# Patient Record
Sex: Female | Born: 1955
Health system: Southern US, Community
[De-identification: ages and names within clinical notes are randomized; demographics above are authoritative.]

## PROBLEM LIST (undated history)

## (undated) DIAGNOSIS — J302 Other seasonal allergic rhinitis: Secondary | ICD-10-CM

## (undated) HISTORY — PX: MOUTH SURGERY: SHX715

## (undated) HISTORY — DX: Other seasonal allergic rhinitis: J30.2

## (undated) HISTORY — PX: ELBOW SURGERY: SHX618

---

## 2003-05-10 ENCOUNTER — Encounter: Admission: RE | Admit: 2003-05-10 | Discharge: 2003-06-01 | Payer: Self-pay | Admitting: Nurse Practitioner

## 2011-08-20 ENCOUNTER — Ambulatory Visit (INDEPENDENT_AMBULATORY_CARE_PROVIDER_SITE_OTHER): Payer: Federal, State, Local not specified - PPO | Admitting: Internal Medicine

## 2011-08-20 ENCOUNTER — Encounter: Payer: Self-pay | Admitting: Internal Medicine

## 2011-08-20 VITALS — BP 130/80 | HR 89 | Temp 97.5°F | Resp 16 | Ht 67.5 in | Wt 195.0 lb

## 2011-08-20 DIAGNOSIS — E663 Overweight: Secondary | ICD-10-CM | POA: Insufficient documentation

## 2011-08-20 DIAGNOSIS — Z780 Asymptomatic menopausal state: Secondary | ICD-10-CM | POA: Insufficient documentation

## 2011-08-20 DIAGNOSIS — N951 Menopausal and female climacteric states: Secondary | ICD-10-CM

## 2011-08-20 DIAGNOSIS — M6580 Other synovitis and tenosynovitis, unspecified site: Secondary | ICD-10-CM

## 2011-08-20 DIAGNOSIS — M778 Other enthesopathies, not elsewhere classified: Secondary | ICD-10-CM

## 2011-08-20 LAB — CBC WITH DIFFERENTIAL/PLATELET
Basophils Absolute: 0 10*3/uL (ref 0.0–0.1)
Basophils Relative: 0 % (ref 0–1)
Eosinophils Absolute: 0.2 10*3/uL (ref 0.0–0.7)
Eosinophils Relative: 2 % (ref 0–5)
HCT: 36.7 % (ref 36.0–46.0)
Hemoglobin: 12.7 g/dL (ref 12.0–15.0)
MCH: 32.6 pg (ref 26.0–34.0)
MCHC: 34.6 g/dL (ref 30.0–36.0)
Monocytes Absolute: 0.7 10*3/uL (ref 0.1–1.0)
Monocytes Relative: 9 % (ref 3–12)
RDW: 14.3 % (ref 11.5–15.5)

## 2011-08-20 LAB — T3, FREE: T3, Free: 2.9 pg/mL (ref 2.3–4.2)

## 2011-08-20 NOTE — Progress Notes (Signed)
  Subjective:    Patient ID: Nicole Haney, female    DOB: 1955/07/14, 56 y.o.   MRN: [id number]  HPI New pt here for first visit.  Former care Dr. Fanny Dance. PMH of elbow tendinitis and .  She is concerned about weight gain surrrounding menopause.  Has occasional vasomotor flushes but not too bothersome.  She exercises and tries to reduce calories but stilll gains weight.  No hypothyroid symptoms.   No Known Allergies History reviewed. No pertinent past medical history. Past Surgical History  Procedure Date  . Elbow surgery     RT  . Mouth surgery     lower jaw gums   History   Social History  . Marital Status: Married    Spouse Name: N/A    Number of Children: N/A  . 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 Smoking status: Current Everyday Smoker -- 0.5 packs/day for 25 years    Types: Cigarettes  . Smokeless tobacco: Never Used  . Alcohol Use: Yes     occassional wine  . Drug Use: No  . Sexually Active: Yes -- Female partner(s)   Other Topics Concern  . Not on file   Social History Narrative  . No narrative on file   Family History  Problem Relation Age of Onset  . Cancer Maternal Grandmother     breast  . Cancer Mother     ovarian   Patient Active Problem List  Diagnoses  . Overweight  . Tendinitis of elbow  . Menopause   Current Outpatient Prescriptions on File Prior to Visit  Medication Sig Dispense Refill  . Calcium Carb-Cholecalciferol (CALCIUM 1000 + D) 1000-800 MG-UNIT TABS Take by mouth daily.            Review of Systems    see HPI Objective:   Physical Exam Physical Exam  Nursing note and vitals reviewed.  Constitutional: She is oriented to person, place, and time. She appears well-developed and well-nourished.  HENT:  Head: Normocephalic and atraumatic.  Cardiovascular: Normal rate and regular rhythm. Exam reveals no gallop and no friction rub.  No murmur heard.    Pulmonary/Chest: Breath sounds normal. She has no wheezes. She has no rales.  Neurological: She is alert and oriented to person, place, and time.  Skin: Skin is warm and dry.  Psychiatric: She has a normal mood and affect. Her behavior is normal.         Assessment & Plan:  1)  Overweight:  Will refer to Dr. Kinnie Scales for consultaion  Check thyroid levels today 2)  Elbow tenditinis 3)  Menopause  Schedule CPE  Check labs today

## 2011-08-21 ENCOUNTER — Telehealth: Payer: Self-pay | Admitting: *Deleted

## 2011-08-21 LAB — COMPREHENSIVE METABOLIC PANEL
Albumin: 4.3 g/dL (ref 3.5–5.2)
Alkaline Phosphatase: 71 U/L (ref 39–117)
BUN: 12 mg/dL (ref 6–23)
Creat: 0.59 mg/dL (ref 0.50–1.10)
Glucose, Bld: 73 mg/dL (ref 70–99)
Total Bilirubin: 0.4 mg/dL (ref 0.3–1.2)

## 2011-08-21 LAB — LIPID PANEL
HDL: 85 mg/dL (ref >39)
LDL Cholesterol: 104 mg/dL — ABNORMAL HIGH (ref 0–99)
Total CHOL/HDL Ratio: 2.4 Ratio
Triglycerides: 72 mg/dL (ref <150)

## 2011-08-21 NOTE — Telephone Encounter (Signed)
Copy of labs mailed to pt's home address.  Pt notified to take 2000units of Vit D3 with her Calcium 1000-1200mg  daily.  Thyroid is normal

## 2012-03-01 ENCOUNTER — Telehealth: Payer: Self-pay | Admitting: *Deleted

## 2012-03-01 NOTE — Telephone Encounter (Signed)
appt for CPE pap and mm in feb

## 2012-05-12 ENCOUNTER — Ambulatory Visit (INDEPENDENT_AMBULATORY_CARE_PROVIDER_SITE_OTHER): Payer: Federal, State, Local not specified - PPO | Admitting: Internal Medicine

## 2012-05-12 ENCOUNTER — Ambulatory Visit (HOSPITAL_BASED_OUTPATIENT_CLINIC_OR_DEPARTMENT_OTHER)
Admission: RE | Admit: 2012-05-12 | Discharge: 2012-05-12 | Disposition: A | Discharge status: Home / Self Care | Payer: Federal, State, Local not specified - PPO | Source: Ambulatory Visit | Attending: Internal Medicine | Admitting: Internal Medicine

## 2012-05-12 ENCOUNTER — Encounter: Payer: Federal, State, Local not specified - PPO | Admitting: Internal Medicine

## 2012-05-12 ENCOUNTER — Encounter: Payer: Self-pay | Admitting: Internal Medicine

## 2012-05-12 VITALS — BP 136/86 | HR 85 | Temp 98.1°F | Resp 18 | Ht 67.5 in | Wt 193.0 lb

## 2012-05-12 DIAGNOSIS — E663 Overweight: Secondary | ICD-10-CM

## 2012-05-12 DIAGNOSIS — Z1231 Encounter for screening mammogram for malignant neoplasm of breast: Secondary | ICD-10-CM | POA: Insufficient documentation

## 2012-05-12 DIAGNOSIS — Z0000 Encounter for general adult medical examination without abnormal findings: Secondary | ICD-10-CM

## 2012-05-12 DIAGNOSIS — M6580 Other synovitis and tenosynovitis, unspecified site: Secondary | ICD-10-CM

## 2012-05-12 DIAGNOSIS — Z780 Asymptomatic menopausal state: Secondary | ICD-10-CM

## 2012-05-12 DIAGNOSIS — M778 Other enthesopathies, not elsewhere classified: Secondary | ICD-10-CM

## 2012-05-12 DIAGNOSIS — F17200 Nicotine dependence, unspecified, uncomplicated: Secondary | ICD-10-CM

## 2012-05-12 LAB — POCT URINALYSIS DIPSTICK
Bilirubin, UA: NEGATIVE
Blood, UA: NEGATIVE
Glucose, UA: NEGATIVE
Ketones, UA: NEGATIVE
Spec Grav, UA: 1.015

## 2012-05-12 NOTE — Progress Notes (Signed)
Subjective:    Patient ID: Nicole Haney, female    DOB: 06/21/1955, 57 y.o.   MRN: [id number]  HPI Nicole Haney is here for CPE/    Doing well.  R elbow tendon repair.  Rarely takes anything for pain  Menopause  Few hot flashes.  No bothersome symptoms  Overweight  She did not see Dr. Kinnie Haney.  Watching diet and trying to exercise  No Known Allergies History reviewed. No pertinent past medical history. Past Surgical History  Procedure Laterality Date  . Elbow surgery      RT  . Mouth surgery      lower jaw gums   History   Social History  . Marital Status: Married    Spouse Name: N/A    Number of Children: N/A  . 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 Smoking status: Current Every Day Smoker -- 0.50 packs/day for 25 years    Types: Cigarettes  . Smokeless tobacco: Never Used  . Alcohol Use: Yes     Comment: occassional wine  . Drug Use: No  . Sexually Active: Yes -- Female partner(s)   Other Topics Concern  . Not on file   Social History Narrative  . No narrative on file   Family History  Problem Relation Age of Onset  . Cancer Maternal Grandmother     breast  . Cancer Mother     ovarian   Patient Active Problem List  Diagnosis  . Overweight  . Tendinitis of elbow  . Menopause   Current Outpatient Prescriptions on File Prior to Visit  Medication Sig Dispense Refill  . Calcium Carb-Cholecalciferol (CALCIUM 1000 + D) 1000-800 MG-UNIT TABS Take by mouth daily.      . calcium carbonate (TUMS - DOSED IN MG ELEMENTAL CALCIUM) 500 MG chewable tablet Chew 1 tablet by mouth as needed.       No current facility-administered medications on file prior to visit.      Review of Systems  All other systems reviewed and are negative.        Objective:   Physical Exam  Physical Exam  Vital signs and nursing note reviewed  Constitutional: She is oriented to person, place, and time. She appears  well-developed and well-nourished. She is cooperative.  HENT:  Head: Normocephalic and atraumatic.  Right Ear: Tympanic membrane normal.  Left Ear: Tympanic membrane normal.  Nose: Nose normal.  Mouth/Throat: Oropharynx is clear and moist and mucous membranes are normal. No oropharyngeal exudate or posterior oropharyngeal erythema.  Eyes: Conjunctivae and EOM are normal. Pupils are equal, round, and reactive to light.  Neck: Neck supple. No JVD present. Carotid bruit is not present. No mass and no thyromegaly present.  Cardiovascular: Regular rhythm, normal heart sounds, intact distal pulses and normal pulses.  Exam reveals no gallop and no friction rub.   No murmur heard. Pulses:      Dorsalis pedis pulses are 2+ on the right side, and 2+ on the left side.  Pulmonary/Chest: Breath sounds normal. She has no wheezes. She has no rhonchi. She has no rales. Right breast exhibits no mass, no nipple discharge and no skin change. Left breast exhibits no mass, no nipple discharge and no skin change.  Abdominal: Soft. Bowel sounds are normal. She exhibits no distension and no mass. There is no hepatosplenomegaly. There is no tenderness. There is no CVA tenderness.  Genitourinary: Rectum normal, vagina normal  and uterus normal. Rectal exam shows no mass. Guaiac negative stool. No labial fusion. There is no lesion on the right labia. There is no lesion on the left labia. Cervix exhibits no motion tenderness. Right adnexum displays no mass, no tenderness and no fullness. Left adnexum displays no mass, no tenderness and no fullness. No erythema around the vagina.  Musculoskeletal:       No active synovitis to any joint.    Lymphadenopathy:       Right cervical: No superficial cervical adenopathy present.      Left cervical: No superficial cervical adenopathy present.       Right axillary: No pectoral and no lateral adenopathy present.       Left axillary: No pectoral and no lateral adenopathy present.       Right: No inguinal adenopathy present.       Left: No inguinal adenopathy present.  Neurological: She is alert and oriented to person, place, and time. She has normal strength and normal reflexes. No cranial nerve deficit or sensory deficit. She displays a negative Romberg sign. Coordination and gait normal.  Skin: Skin is warm and dry. No abrasion, no bruising, no ecchymosis and no rash noted. No cyanosis. Nails show no clubbing.  Psychiatric: She has a normal mood and affect. Her speech is normal and behavior is normal.          Assessment & Plan:  Health Maintenance:  See scanned HM sheet  MM today  Declines all vaccines Overweight   DASH diet given  Tobacco use:  Cessation counseling given.  nOt intrested in RX  Tendinitis  Use Nsaid prn      Assessment & Plan:

## 2012-05-12 NOTE — Patient Instructions (Addendum)
See me in office if not better

## 2012-05-19 ENCOUNTER — Encounter: Payer: Self-pay | Admitting: *Deleted

## 2012-07-29 LAB — HM PAP SMEAR: HM Pap smear: NORMAL

## 2012-12-31 ENCOUNTER — Telehealth: Payer: Self-pay | Admitting: *Deleted

## 2012-12-31 ENCOUNTER — Emergency Department (HOSPITAL_BASED_OUTPATIENT_CLINIC_OR_DEPARTMENT_OTHER): Payer: Federal, State, Local not specified - PPO

## 2012-12-31 ENCOUNTER — Encounter (HOSPITAL_BASED_OUTPATIENT_CLINIC_OR_DEPARTMENT_OTHER): Payer: Self-pay | Admitting: *Deleted

## 2012-12-31 ENCOUNTER — Emergency Department (HOSPITAL_BASED_OUTPATIENT_CLINIC_OR_DEPARTMENT_OTHER)
Admission: EM | Admit: 2012-12-31 | Discharge: 2012-12-31 | Disposition: A | Discharge status: Home / Self Care | Payer: Federal, State, Local not specified - PPO | Attending: Emergency Medicine | Admitting: Emergency Medicine

## 2012-12-31 DIAGNOSIS — Z79899 Other long term (current) drug therapy: Secondary | ICD-10-CM | POA: Insufficient documentation

## 2012-12-31 DIAGNOSIS — K529 Noninfective gastroenteritis and colitis, unspecified: Secondary | ICD-10-CM

## 2012-12-31 DIAGNOSIS — F17200 Nicotine dependence, unspecified, uncomplicated: Secondary | ICD-10-CM | POA: Insufficient documentation

## 2012-12-31 DIAGNOSIS — K5289 Other specified noninfective gastroenteritis and colitis: Secondary | ICD-10-CM | POA: Insufficient documentation

## 2012-12-31 LAB — COMPREHENSIVE METABOLIC PANEL
ALT: 13 U/L (ref 0–35)
Alkaline Phosphatase: 84 U/L (ref 39–117)
BUN: 11 mg/dL (ref 6–23)
CO2: 28 mEq/L (ref 19–32)
Calcium: 10.1 mg/dL (ref 8.4–10.5)
Chloride: 96 mEq/L (ref 96–112)
GFR calc Af Amer: >90 mL/min (ref >90)
Glucose, Bld: 99 mg/dL (ref 70–99)
Potassium: 4 mEq/L (ref 3.5–5.1)
Sodium: 134 mEq/L — ABNORMAL LOW (ref 135–145)
Total Bilirubin: 0.5 mg/dL (ref 0.3–1.2)

## 2012-12-31 LAB — URINALYSIS, ROUTINE W REFLEX MICROSCOPIC
Glucose, UA: NEGATIVE mg/dL
Hgb urine dipstick: NEGATIVE
Specific Gravity, Urine: 1.009 (ref 1.005–1.030)
pH: 7.5 (ref 5.0–8.0)

## 2012-12-31 LAB — LIPASE, BLOOD: Lipase: 20 U/L (ref 11–59)

## 2012-12-31 LAB — CBC
HCT: 43.7 % (ref 36.0–46.0)
Hemoglobin: 15.3 g/dL — ABNORMAL HIGH (ref 12.0–15.0)
RBC: 4.56 MIL/uL (ref 3.87–5.11)
WBC: 9.4 10*3/uL (ref 4.0–10.5)

## 2012-12-31 MED ORDER — ONDANSETRON 4 MG PO TBDP
4.0000 mg | ORAL_TABLET | Freq: Once | ORAL | Status: AC
  Administered 2012-12-31: 4 mg via ORAL

## 2012-12-31 MED ORDER — IOHEXOL 300 MG/ML  SOLN
100.0000 mL | Freq: Once | INTRAMUSCULAR | Status: AC | PRN
  Administered 2012-12-31: 100 mL via INTRAVENOUS

## 2012-12-31 MED ORDER — SODIUM CHLORIDE 0.9 % IV BOLUS (SEPSIS)
1000.0000 mL | Freq: Once | INTRAVENOUS | Status: AC
  Administered 2012-12-31: 1000 mL via INTRAVENOUS

## 2012-12-31 MED ORDER — IOHEXOL 300 MG/ML  SOLN
50.0000 mL | Freq: Once | INTRAMUSCULAR | Status: AC | PRN
  Administered 2012-12-31: 50 mL via ORAL

## 2012-12-31 MED ORDER — ONDANSETRON 4 MG PO TBDP
ORAL_TABLET | ORAL | Status: AC
  Filled 2012-12-31: qty 1

## 2012-12-31 MED ORDER — OXYCODONE-ACETAMINOPHEN 5-325 MG PO TABS: 1.0000 | ORAL_TABLET | Freq: Four times a day (QID) | ORAL | Status: DC | PRN

## 2012-12-31 MED ORDER — MORPHINE SULFATE 4 MG/ML IJ SOLN
4.0000 mg | Freq: Once | INTRAMUSCULAR | Status: AC
  Administered 2012-12-31: 4 mg via INTRAVENOUS
  Filled 2012-12-31: qty 1

## 2012-12-31 NOTE — ED Notes (Signed)
MD at bedside. 

## 2012-12-31 NOTE — Telephone Encounter (Signed)
Pt called with a C/O abd pain pt in severe pain advised pt to come to [facility] ED right now will check on pt

## 2012-12-31 NOTE — ED Notes (Signed)
Patient given 1st cup of oral contrast at 9:50 and asked her to have that cup drank by 10:10

## 2012-12-31 NOTE — ED Provider Notes (Signed)
CSN: [id number]     Arrival date & time 12/31/12  0865 History   First MD Initiated Contact with Patient 12/31/12 [phone]     Chief Complaint  Patient presents with  . Abdominal Pain   (Consider location/radiation/quality/duration/timing/severity/associated sxs/prior Treatment) Patient is a 57 y.o. female presenting with abdominal pain. The history is provided by the patient.  Abdominal Pain Pain location:  Epigastric and periumbilical Pain quality: aching and sharp   Pain radiates to:  Does not radiate Pain severity:  Moderate Onset quality:  Gradual Timing:  Constant Progression:  Worsening Chronicity:  New Context: sick contacts (volunteers at a school)   Context: not alcohol use, not recent illness, not recent travel, not retching, not suspicious food intake and not trauma   Relieved by:  Nothing Worsened by:  Nothing tried Associated symptoms: diarrhea (last BM this morning) and nausea   Associated symptoms: no cough, no fever and no vomiting     History reviewed. No pertinent past medical history. Past Surgical History  Procedure Laterality Date  . Elbow surgery      RT  . Mouth surgery      lower jaw gums   Family History  Problem Relation Age of Onset  . Cancer Maternal Grandmother     breast  . Cancer Mother     ovarian   History  Substance Use Topics  . Smoking status: Current Every Day Smoker -- 0.50 packs/day for 25 years    Types: Cigarettes  . Smokeless tobacco: Never Used  . Alcohol Use: Yes     Comment: occassional wine   OB History   Grav Para Term Preterm Abortions TAB SAB Ect Mult Living   4 2   2  2   2      Review of Systems  Constitutional: Negative for fever.  Respiratory: Negative for cough.   Gastrointestinal: Positive for nausea, abdominal pain and diarrhea (last BM this morning). Negative for vomiting.  All other systems reviewed and are negative.    Allergies  Review of patient's allergies indicates no known allergies.  Home  Medications   Current Outpatient Rx  Name  Route  Sig  Dispense  Refill  . Calcium Carb-Cholecalciferol (CALCIUM 1000 + D) 1000-800 MG-UNIT TABS   Oral   Take by mouth daily.         . calcium carbonate (TUMS - DOSED IN MG ELEMENTAL CALCIUM) 500 MG chewable tablet   Oral   Chew 1 tablet by mouth as needed.          BP 144/96  Pulse 85  Temp(Src) 97.8 F (36.6 C) (Oral)  Resp 20  SpO2 99% Physical Exam  Nursing note and vitals reviewed. Constitutional: She is oriented to person, place, and time. She appears well-developed and well-nourished. No distress.  HENT:  Head: Normocephalic and atraumatic.  Eyes: EOM are normal. Pupils are equal, round, and reactive to light.  Neck: Normal range of motion. Neck supple.  Cardiovascular: Normal rate and regular rhythm.  Exam reveals no friction rub.   No murmur heard. Pulmonary/Chest: Effort normal and breath sounds normal. No respiratory distress. She has no wheezes. She has no rales.  Abdominal: Soft. She exhibits no distension and no mass. There is tenderness (epigastrum, LLQ, RLQ). There is no rebound and no guarding.  Musculoskeletal: Normal range of motion. She exhibits no edema.  Neurological: She is alert and oriented to person, place, and time.  Skin: She is not diaphoretic.    ED Course  Procedures (including critical care time) Labs Review Labs Reviewed  URINALYSIS, ROUTINE W REFLEX MICROSCOPIC  CBC  COMPREHENSIVE METABOLIC PANEL  LIPASE, BLOOD  LACTIC ACID, PLASMA   Imaging Review Ct Abdomen Pelvis W Contrast  12/31/2012   CLINICAL DATA:  Central abdominal pain.  EXAM: CT ABDOMEN AND PELVIS WITH CONTRAST  TECHNIQUE: Multidetector CT imaging of the abdomen and pelvis was performed using the standard protocol following bolus administration of intravenous contrast.  CONTRAST:  OMNIPAQUE IOHEXOL 300 MG/ML SOLN, 50mL OMNIPAQUE IOHEXOL 300 MG/ML SOLN  COMPARISON:  None.  FINDINGS: The lung bases are clear without  focal nodule, mass, or airspace disease. The heart size is normal. No significant pleural or pericardial effusion is present.  The liver and spleen are within normal limits. The stomach, duodenum, and pancreas are unremarkable. The common bile duct and gallbladder are within normal limits. The adrenal glands are normal bilaterally. The kidneys and ureters are within normal limits. The urinary bladder is unremarkable.  The rectus sigmoid colon is within normal limits. The descending and distal transverse colon is within normal limits. There is a focal area of marked wall thickening at the hepatic flexure and proximal transverse colon. Slight inflammatory changes are noted within the mesenteries. Additional irregular thickening is noted at the cecum. No significant adenopathy is associated.  The appendix is visualized and normal. Scattered vascular calcifications are present within the aorta without aneurysm.  The uterus and adnexa are within normal limits for age.  The bone windows demonstrate no focal lytic or blastic lesions. A vacuum disc is present at L5-S1.  IMPRESSION: 1. Circumferential thickening about the colon at the hepatic flexure with asymmetric wall thickening in the proximal transverse colon. Irregular wall thickening is also present in the cecum. Relatively little inflammatory changes associated with these areas. The differential diagnosis includes adenocarcinoma of the colon. Colitis, either infectious or ischemic is also considered. Inflammatory bowel disease, specifically Crohn's disease is also considered. However, the terminal ileum is relatively normal. 2. Normal appearance of the appendix.  These results were called by telephone at the time of interpretation on 12/31/2012 at 12:19 PM to Dr. Marena Chancy , who verbally acknowledged these results.   Electronically Signed   By: Gennette Pac M.D.   On: 12/31/2012 12:20     MDM   1. Colitis    57F with constant central abdominal pain.  Sharp, non radiating, however migrates around lower abdomen. No fevers. Mild nausea, associated diarrhea. Prior abdominal surgery: BTL.  AFVSS here. Epigastric pain, no RUQ tenderness, bilateral lower quadrant tenderness. History not c/w kidney stone. Could be possible appendicitis, colitis. Will CT scan. Pain meds, fluids given.  The CT shows possible mass of his hepatic flexure of colon. Minimal inflammatory changes of colon to suggest colitis.  Differential also includes IBD, ischemic colitis. I spoke with the GI doctor but you'll GI he stated that he made a scope, however doesn't need or emergently. I spoke to the patient about these results he states she had a colonoscopy 6 months ago that was normal.  Her acute presentation seems less likely for this to be malignant. I spoke again with Dr. Alfredo Batty about possible ischemic colitis and the need for a CT angiogram of the abdomen, he stated that she has no inflammatory changes around the vessels and all the vessels appear patent based on the scan and he didn't feel an extra angiography would add anymore information. I spoke to the patient about this. She would like to  go home she is feeling better. Her labs are normal. She doesn't have any acute surgical emergency. Like this is reasonable. Patient given strict return precautions. Instructed to followup with her regular doctor and with her GI doctor soon as possible. Stable for discharge.    Dagmar Hait, MD 12/31/12 [phone]

## 2012-12-31 NOTE — ED Notes (Signed)
Pt drinking second cup of oral contrast.

## 2012-12-31 NOTE — ED Notes (Signed)
Patient transported to CT 

## 2012-12-31 NOTE — ED Notes (Signed)
Mid abdominal pain onset yesterday morning along with diarrhea having some nausea no emesis. Pt states the pain worse when she moves or takes a deep breath. Denies any radiation of the pain.

## 2013-01-03 ENCOUNTER — Encounter: Payer: Self-pay | Admitting: *Deleted

## 2013-01-03 ENCOUNTER — Ambulatory Visit (INDEPENDENT_AMBULATORY_CARE_PROVIDER_SITE_OTHER): Payer: Federal, State, Local not specified - PPO | Admitting: Internal Medicine

## 2013-01-03 ENCOUNTER — Encounter: Payer: Self-pay | Admitting: Internal Medicine

## 2013-01-03 VITALS — BP 127/88 | HR 80 | Temp 97.4°F | Resp 18 | Wt 190.0 lb

## 2013-01-03 DIAGNOSIS — R109 Unspecified abdominal pain: Secondary | ICD-10-CM

## 2013-01-03 DIAGNOSIS — K5289 Other specified noninfective gastroenteritis and colitis: Secondary | ICD-10-CM

## 2013-01-03 DIAGNOSIS — K529 Noninfective gastroenteritis and colitis, unspecified: Secondary | ICD-10-CM

## 2013-01-03 LAB — CBC WITH DIFFERENTIAL/PLATELET
Basophils Relative: 0 % (ref 0–1)
Eosinophils Absolute: 0.1 10*3/uL (ref 0.0–0.7)
Eosinophils Relative: 2 % (ref 0–5)
HCT: 44.6 % (ref 36.0–46.0)
Lymphocytes Relative: 22 % (ref 12–46)
Lymphs Abs: 1.9 10*3/uL (ref 0.7–4.0)
MCH: 34.1 pg — ABNORMAL HIGH (ref 26.0–34.0)
MCHC: 35.2 g/dL (ref 30.0–36.0)
MCV: 96.7 fL (ref 78.0–100.0)
Monocytes Relative: 9 % (ref 3–12)
Neutro Abs: 5.9 10*3/uL (ref 1.7–7.7)
Neutrophils Relative %: 67 % (ref 43–77)
Platelets: 290 10*3/uL (ref 150–400)
RBC: 4.61 MIL/uL (ref 3.87–5.11)
WBC: 8.7 10*3/uL (ref 4.0–10.5)

## 2013-01-03 LAB — BASIC METABOLIC PANEL
CO2: 27 mEq/L (ref 19–32)
Calcium: 10 mg/dL (ref 8.4–10.5)
Chloride: 101 mEq/L (ref 96–112)
Creat: 0.59 mg/dL (ref 0.50–1.10)
Glucose, Bld: 83 mg/dL (ref 70–99)
Sodium: 135 mEq/L (ref 135–145)

## 2013-01-03 NOTE — Progress Notes (Signed)
Subjective:    Patient ID: Nicole Haney, female    DOB: [birth year], 57 y.o.   MRN: [id number]  HPI  Nicole Haney is here for ER follow up.  She was seen in ER on 10/3 with severe abd pain and nausea and diarrhea  CT showed  Question of inflammatory disease or possibly ischemic bowel with thickening in hepatic flexure.    She does have a GI MD [place]. [place]  She tells me she had a colonoscopy 6 months ago and told it was fine except for diverticulosis  No Known Allergies History reviewed. No pertinent past medical history. Past Surgical History  Procedure Laterality Date  . Elbow surgery      RT  . Mouth surgery      lower jaw gums   History   Social History  . Marital Status: Married    Spouse Name: N/A    Number of Children: N/A  . 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 Smoking status: Current Every Day Smoker -- 0.50 packs/day for 25 years    Types: Cigarettes  . Smokeless tobacco: Never Used  . Alcohol Use: Yes     Comment: occassional wine  . Drug Use: No  . Sexual Activity: Yes    Partners: Male   Other Topics Concern  . Not on file   Social History Narrative  . No narrative on file   Family History  Problem Relation Age of Onset  . Cancer Maternal Grandmother     breast  . Cancer Mother     ovarian   Patient Active Problem List   Diagnosis Date Noted  . Tobacco use disorder 05/12/2012  . Overweight 08/20/2011  . Tendinitis of elbow 08/20/2011  . Menopause 08/20/2011   Current Outpatient Prescriptions on File Prior to Visit  Medication Sig Dispense Refill  . Calcium Carb-Cholecalciferol (CALCIUM 1000 + D) 1000-800 MG-UNIT TABS Take by mouth daily.      . calcium carbonate (TUMS - DOSED IN MG ELEMENTAL CALCIUM) 500 MG chewable tablet Chew 1 tablet by mouth as needed.      Marland Kitchen oxyCODONE-acetaminophen (PERCOCET/ROXICET) 5-325 MG per tablet Take 1 tablet by mouth every 6 (six) hours as needed  for pain.  15 tablet  0   No current facility-administered medications on file prior to visit.           Review of Systems    see HPI Objective:   Physical Exam  Physical Exam  Nursing note and vitals reviewed.  Constitutional: She is oriented to person, place, and time. She appears well-developed and well-nourished.  HENT:  Head: Normocephalic and atraumatic.  Cardiovascular: Normal rate and regular rhythm. Exam reveals no gallop and no friction rub.  No murmur heard.  Pulmonary/Chest: Breath sounds normal. She has no wheezes. She has no rales.  ABd  BS POs No HSM.  She is minimally tender in both lower quadrant.  Rectal no mass guaiac neg Neurological: She is alert and oriented to person, place, and time.  Skin: Skin is warm and dry.  Psychiatric: She has a normal mood and affect. Her behavior is normal.            Assessment & Plan:  Abd pain /  Abnormal CT at hepatic flexure  Will check CBC and BMP today   I advised pt to call her GI MD and make appt and that it is  very important that she have follow up colonoscopy.  She voiced understadning

## 2013-01-21 ENCOUNTER — Telehealth: Payer: Self-pay | Admitting: *Deleted

## 2013-01-21 NOTE — Telephone Encounter (Signed)
Notified pt that Dr Constance Goltz will not be able to sign her FMLA papers that Gi MD will have to do that.notified pt that Dr Constance Goltz will give her a note from her ER visit and until GI appt. Left VMM awaiting return call

## 2013-03-31 HISTORY — PX: ELBOW SURGERY: SHX618

## 2013-07-03 ENCOUNTER — Emergency Department (HOSPITAL_BASED_OUTPATIENT_CLINIC_OR_DEPARTMENT_OTHER)
Admission: EM | Admit: 2013-07-03 | Discharge: 2013-07-03 | Disposition: A | Discharge status: Home / Self Care | Payer: Federal, State, Local not specified - PPO | Attending: Emergency Medicine | Admitting: Emergency Medicine

## 2013-07-03 ENCOUNTER — Encounter (HOSPITAL_BASED_OUTPATIENT_CLINIC_OR_DEPARTMENT_OTHER): Payer: Self-pay | Admitting: Emergency Medicine

## 2013-07-03 DIAGNOSIS — Z79899 Other long term (current) drug therapy: Secondary | ICD-10-CM | POA: Insufficient documentation

## 2013-07-03 DIAGNOSIS — F17200 Nicotine dependence, unspecified, uncomplicated: Secondary | ICD-10-CM | POA: Insufficient documentation

## 2013-07-03 DIAGNOSIS — J029 Acute pharyngitis, unspecified: Secondary | ICD-10-CM

## 2013-07-03 DIAGNOSIS — R52 Pain, unspecified: Secondary | ICD-10-CM | POA: Insufficient documentation

## 2013-07-03 LAB — RAPID STREP SCREEN (MED CTR MEBANE ONLY): Streptococcus, Group A Screen (Direct): NEGATIVE

## 2013-07-03 NOTE — Discharge Instructions (Signed)
Pharyngitis °Pharyngitis is redness, pain, and swelling (inflammation) of your pharynx.  °CAUSES  °Pharyngitis is usually caused by infection. Most of the time, these infections are from viruses (viral) and are part of a cold. However, sometimes pharyngitis is caused by bacteria (bacterial). Pharyngitis can also be caused by allergies. Viral pharyngitis may be spread from person to person by coughing, sneezing, and personal items or utensils (cups, forks, spoons, toothbrushes). Bacterial pharyngitis may be spread from person to person by more intimate contact, such as kissing.  °SIGNS AND SYMPTOMS  °Symptoms of pharyngitis include:   °· Sore throat.   °· Tiredness (fatigue).   °· Low-grade fever.   °· Headache. °· Joint pain and muscle aches. °· Skin rashes. °· Swollen lymph nodes. °· Plaque-like film on throat or tonsils (often seen with bacterial pharyngitis). °DIAGNOSIS  °Your health care provider will ask you questions about your illness and your symptoms. Your medical history, along with a physical exam, is often all that is needed to diagnose pharyngitis. Sometimes, a rapid strep test is done. Other lab tests may also be done, depending on the suspected cause.  °TREATMENT  °Viral pharyngitis will usually get better in 3 4 days without the use of medicine. Bacterial pharyngitis is treated with medicines that kill germs (antibiotics).  °HOME CARE INSTRUCTIONS  °· Drink enough water and fluids to keep your urine clear or pale yellow.   °· Only take over-the-counter or prescription medicines as directed by your health care provider:   °· If you are prescribed antibiotics, make sure you finish them even if you start to feel better.   °· Do not take aspirin.   °· Get lots of rest.   °· Gargle with 8 oz of salt water (½ tsp of salt per 1 qt of water) as often as every 1 2 hours to soothe your throat.   °· Throat lozenges (if you are not at risk for choking) or sprays may be used to soothe your throat. °SEEK MEDICAL  CARE IF:  °· You have large, tender lumps in your neck. °· You have a rash. °· You cough up green, yellow-brown, or bloody spit. °SEEK IMMEDIATE MEDICAL CARE IF:  °· Your neck becomes stiff. °· You drool or are unable to swallow liquids. °· You vomit or are unable to keep medicines or liquids down. °· You have severe pain that does not go away with the use of recommended medicines. °· You have trouble breathing (not caused by a stuffy nose). °MAKE SURE YOU:  °· Understand these instructions. °· Will watch your condition. °· Will get help right away if you are not doing well or get worse. °Document Released: 03/17/2005 Document Revised: 01/05/2013 Document Reviewed: 11/22/2012 °[employer]® Patient Information ©2014 [employer]. ° °

## 2013-07-03 NOTE — ED Provider Notes (Signed)
Medical screening examination/treatment/procedure(s) were performed by non-physician practitioner and as supervising physician I was immediately available for consultation/collaboration.   EKG Interpretation None        Gwyneth SproutWhitney Vaeda Westall, MD 07/03/13 1444

## 2013-07-03 NOTE — ED Notes (Signed)
Sore throat, chills, generalized body aches since Thursday.  Pt states she feels worn out.

## 2013-07-03 NOTE — ED Provider Notes (Signed)
CSN: [id number]     Arrival date & time 07/03/13  1137 History   First MD Initiated Contact with Patient 07/03/13 1203     Chief Complaint  Patient presents with  . Sore Throat  . Generalized Body Aches     (Consider location/radiation/quality/duration/timing/severity/associated sxs/prior Treatment) Patient is a 58 y.o. female presenting with pharyngitis. The history is provided by the patient. No language interpreter was used.  Sore Throat This is a new problem. Episode onset: 4 days. The problem occurs constantly. The problem has been unchanged. Associated symptoms include a sore throat. Pertinent negatives include no fever or headaches. Nothing aggravates the symptoms. She has tried nothing for the symptoms. The treatment provided no relief.  Pt complains of a sorethroat.   No fever,  No cough  No past medical history on file. Past Surgical History  Procedure Laterality Date  . Elbow surgery      RT  . Mouth surgery      lower jaw gums   Family History  Problem Relation Age of Onset  . Cancer Maternal Grandmother     breast  . Cancer Mother     ovarian   History  Substance Use Topics  . Smoking status: Current Every Day Smoker -- 0.50 packs/day for 25 years    Types: Cigarettes  . Smokeless tobacco: Never Used  . Alcohol Use: Yes     Comment: occassional wine   OB History   Grav Para Term Preterm Abortions TAB SAB Ect Mult Living   4 2   2  2   2      Review of Systems  Constitutional: Negative for fever.  HENT: Positive for sore throat.   Neurological: Negative for headaches.  All other systems reviewed and are negative.      Allergies  Review of patient's allergies indicates no known allergies.  Home Medications   Current Outpatient Rx  Name  Route  Sig  Dispense  Refill  . Calcium Carb-Cholecalciferol (CALCIUM 1000 + D) 1000-800 MG-UNIT TABS   Oral   Take by mouth daily.         . calcium carbonate (TUMS - DOSED IN MG ELEMENTAL CALCIUM) 500 MG  chewable tablet   Oral   Chew 1 tablet by mouth as needed.         . folic acid (FOLVITE) 1 MG tablet   Oral   Take 1 mg by mouth daily.         . Probiotic Product (PROBIOTIC DAILY PO)   Oral   Take 1 tablet by mouth daily.          BP 139/89  Pulse 95  Temp(Src) 98.3 F (36.8 C) (Oral)  Resp 20  Ht 5\' 7"  (1.702 m)  Wt 175 lb (79.379 kg)  BMI 27.40 kg/m2  SpO2 99% Physical Exam  Nursing note and vitals reviewed. Constitutional: She is oriented to person, place, and time. She appears well-developed and well-nourished.  HENT:  Head: Normocephalic and atraumatic.  Right Ear: External ear normal.  Left Ear: External ear normal.  Nose: Nose normal.  Eyes: Pupils are equal, round, and reactive to light.  Neck: Normal range of motion.  Cardiovascular: Normal rate.   Pulmonary/Chest: Effort normal.  Abdominal: Soft.  Musculoskeletal: Normal range of motion.  Neurological: She is alert and oriented to person, place, and time. She has normal reflexes.  Skin: Skin is warm.  Psychiatric: She has a normal mood and affect.    ED Course  Procedures (including critical care time) Labs Review Labs Reviewed  RAPID STREP SCREEN   Imaging Review No results found.   EKG Interpretation None      MDM   Final diagnoses:  Pharyngitis    Strep negative,  Probable viral illness.   See primary for recheck in 3-4 days.   Ibuprofen, Increase fluids    Elson Areas, [place] 07/03/13 1227

## 2013-07-05 LAB — CULTURE, GROUP A STREP

## 2014-01-30 ENCOUNTER — Encounter (HOSPITAL_BASED_OUTPATIENT_CLINIC_OR_DEPARTMENT_OTHER): Payer: Self-pay | Admitting: Emergency Medicine

## 2014-07-12 ENCOUNTER — Other Ambulatory Visit: Payer: Self-pay | Admitting: *Deleted

## 2014-07-12 ENCOUNTER — Ambulatory Visit (INDEPENDENT_AMBULATORY_CARE_PROVIDER_SITE_OTHER): Payer: Federal, State, Local not specified - PPO | Admitting: Internal Medicine

## 2014-07-12 ENCOUNTER — Encounter: Payer: Self-pay | Admitting: Internal Medicine

## 2014-07-12 VITALS — BP 140/88 | HR 97 | Resp 16 | Ht 67.5 in | Wt 223.0 lb

## 2014-07-12 DIAGNOSIS — J301 Allergic rhinitis due to pollen: Secondary | ICD-10-CM

## 2014-07-12 DIAGNOSIS — H109 Unspecified conjunctivitis: Secondary | ICD-10-CM

## 2014-07-12 DIAGNOSIS — Z0000 Encounter for general adult medical examination without abnormal findings: Secondary | ICD-10-CM

## 2014-07-12 LAB — LIPID PANEL
Cholesterol: 199 mg/dL (ref 0–200)
HDL: 84 mg/dL (ref >=46)
LDL Cholesterol: 102 mg/dL — ABNORMAL HIGH (ref 0–99)
TRIGLYCERIDES: 67 mg/dL (ref <150)
Total CHOL/HDL Ratio: 2.4 Ratio
VLDL: 13 mg/dL (ref 0–40)

## 2014-07-12 LAB — CBC WITH DIFFERENTIAL/PLATELET
BASOS ABS: 0 10*3/uL (ref 0.0–0.1)
BASOS PCT: 0 % (ref 0–1)
EOS PCT: 1 % (ref 0–5)
Eosinophils Absolute: 0.1 10*3/uL (ref 0.0–0.7)
HEMATOCRIT: 39.4 % (ref 36.0–46.0)
HEMOGLOBIN: 13.3 g/dL (ref 12.0–15.0)
LYMPHS PCT: 19 % (ref 12–46)
Lymphs Abs: 1.5 10*3/uL (ref 0.7–4.0)
MCH: 32.1 pg (ref 26.0–34.0)
MCHC: 33.8 g/dL (ref 30.0–36.0)
MCV: 95.2 fL (ref 78.0–100.0)
MPV: 9.5 fL (ref 8.6–12.4)
Monocytes Absolute: 0.6 10*3/uL (ref 0.1–1.0)
Monocytes Relative: 8 % (ref 3–12)
Neutro Abs: 5.5 10*3/uL (ref 1.7–7.7)
Neutrophils Relative %: 72 % (ref 43–77)
Platelets: 276 10*3/uL (ref 150–400)
RBC: 4.14 MIL/uL (ref 3.87–5.11)
RDW: 13.9 % (ref 11.5–15.5)
WBC: 7.7 10*3/uL (ref 4.0–10.5)

## 2014-07-12 LAB — COMPLETE METABOLIC PANEL WITH GFR
ALT: 14 U/L (ref 0–35)
AST: 14 U/L (ref 0–37)
Albumin: 4.3 g/dL (ref 3.5–5.2)
Alkaline Phosphatase: 66 U/L (ref 39–117)
BUN: 20 mg/dL (ref 6–23)
CO2: 24 mEq/L (ref 19–32)
CREATININE: 0.87 mg/dL (ref 0.50–1.10)
Calcium: 9.5 mg/dL (ref 8.4–10.5)
Chloride: 102 mEq/L (ref 96–112)
GFR, Est African American: 86 mL/min
GFR, Est Non African American: 75 mL/min
Glucose, Bld: 88 mg/dL (ref 70–99)
Potassium: 4.2 mEq/L (ref 3.5–5.3)
Sodium: 138 mEq/L (ref 135–145)
Total Bilirubin: 0.4 mg/dL (ref 0.2–1.2)
Total Protein: 6.9 g/dL (ref 6.0–8.3)

## 2014-07-12 LAB — TSH: TSH: 3.766 u[IU]/mL (ref 0.350–4.500)

## 2014-07-12 MED ORDER — FLUTICASONE PROPIONATE 50 MCG/ACT NA SUSP: 2.0000 | Freq: Every day | NASAL | Status: DC

## 2014-07-12 MED ORDER — AZELASTINE HCL 0.05 % OP SOLN: 1.0000 [drp] | Freq: Two times a day (BID) | OPHTHALMIC | Status: DC

## 2014-07-12 NOTE — Progress Notes (Signed)
Subjective:    Patient ID: Nicole Haney, female    DOB: 1956-03-18, 59 y.o.   MRN: [id number]  HPI 05/12/2012 note Assessment & Plan:  Health Maintenance: See scanned HM sheet MM today Declines all vaccines Overweight DASH diet given  Tobacco use: Cessation counseling given. nOt intrested in RX  Tendinitis Use Nsaid prn       TODAY  Nicole Haney is here for acute visit   I have not seen her in over 2 years  Works outdoors and having itchy eyes, sneezing.  No wheezing or SOB   Benadryl makes her drowsy  No Known Allergies No past medical history on file. Past Surgical History  Procedure Laterality Date  . Elbow surgery      RT  . Mouth surgery      lower jaw gums   History   Social History  . Marital Status: Married    Spouse Name: N/A  . Number of Children: N/A  . 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 Smoking status: Current Every Day Smoker -- 0.50 packs/day for 25 years    Types: Cigarettes  . Smokeless tobacco: Never Used  . Alcohol Use: Yes     Comment: occassional wine  . Drug Use: No  . Sexual Activity:    Partners: Male   Other Topics Concern  . Not on file   Social History Narrative   Family History  Problem Relation Age of Onset  . Cancer Maternal Grandmother     breast  . Cancer Mother     ovarian   Patient Active Problem List   Diagnosis Date Noted  . Tobacco use disorder 05/12/2012  . Overweight(278.02) 08/20/2011  . Tendinitis of elbow 08/20/2011  . Menopause 08/20/2011   Current Outpatient Prescriptions on File Prior to Visit  Medication Sig Dispense Refill  . Calcium Carb-Cholecalciferol (CALCIUM 1000 + D) 1000-800 MG-UNIT TABS Take by mouth daily.    . calcium carbonate (TUMS - DOSED IN MG ELEMENTAL CALCIUM) 500 MG chewable tablet Chew 1 tablet by mouth as needed.    . folic acid (FOLVITE) 1 MG tablet Take 1 mg by mouth daily.    . Probiotic Product  (PROBIOTIC DAILY PO) Take 1 tablet by mouth daily.     No current facility-administered medications on file prior to visit.       Review of Systems See HPI    Objective:   Physical Exam Physical Exam  Nursing note and vitals reviewed.  Constitutional: She is oriented to person, place, and time. She appears well-developed and well-nourished.  HENT:  Eyes  Mild conjunctival redness Head: Normocephalic and atraumatic.  Tm"s serous effusions bilaterally O/P no lesions no exudates  Cardiovascular: Normal rate and regular rhythm. Exam reveals no gallop and no friction rub.  No murmur heard.  Pulmonary/Chest: Breath sounds normal. She has no wheezes. She has no rales.  Neurological: She is alert and oriented to person, place, and time.  Skin: Skin is warm and dry.  Psychiatric: She has a normal mood and affect. Her behavior is normal.         Assessment & Plan:  Allergic conjuctivitis:  Optivar bid OU  No cough  Allergic rhinitis   flonase nasal spray and OTC antihistamine of choice  Pt advised of my departure and given letter with alternative PCP options.  She was advised to make appt with new provider  Will  get labs today

## 2014-07-13 LAB — VITAMIN D 25 HYDROXY (VIT D DEFICIENCY, FRACTURES): VIT D 25 HYDROXY: 17 ng/mL — AB (ref 30–100)

## 2014-07-17 ENCOUNTER — Telehealth: Payer: Self-pay | Admitting: Internal Medicine

## 2014-07-17 MED ORDER — VITAMIN D (ERGOCALCIFEROL) 1.25 MG (50000 UNIT) PO CAPS: ORAL_CAPSULE | ORAL | Status: DC

## 2014-07-17 NOTE — Telephone Encounter (Signed)
I spoke with Nicole Haney about her lab results and I mailed her a copy -eh

## 2014-07-17 NOTE — Telephone Encounter (Signed)
Call pt and let her know her vitamin D is very low.    [facility],000 units once a week for 12 weeks then  Over the counter  1000 units daily   Labs on your desk

## 2014-08-18 ENCOUNTER — Telehealth: Payer: Self-pay | Admitting: *Deleted

## 2014-08-18 ENCOUNTER — Encounter: Payer: Self-pay | Admitting: *Deleted

## 2014-08-18 NOTE — Telephone Encounter (Signed)
Pre-Visit Call completed with patient and chart updated.   Pre-Visit Info documented in Specialty Comments under Snap[employer]ot.    

## 2014-08-21 ENCOUNTER — Encounter: Payer: Self-pay | Admitting: Physician Assistant

## 2014-08-21 ENCOUNTER — Ambulatory Visit (HOSPITAL_BASED_OUTPATIENT_CLINIC_OR_DEPARTMENT_OTHER)
Admission: RE | Admit: 2014-08-21 | Discharge: 2014-08-21 | Disposition: A | Discharge status: Home / Self Care | Payer: Federal, State, Local not specified - PPO | Source: Ambulatory Visit | Attending: Physician Assistant | Admitting: Physician Assistant

## 2014-08-21 ENCOUNTER — Ambulatory Visit (INDEPENDENT_AMBULATORY_CARE_PROVIDER_SITE_OTHER): Payer: Federal, State, Local not specified - PPO | Admitting: Physician Assistant

## 2014-08-21 ENCOUNTER — Ambulatory Visit (HOSPITAL_BASED_OUTPATIENT_CLINIC_OR_DEPARTMENT_OTHER): Payer: Federal, State, Local not specified - PPO

## 2014-08-21 VITALS — BP 144/89 | HR 74 | Temp 98.0°F | Ht 66.75 in | Wt 224.6 lb

## 2014-08-21 DIAGNOSIS — R1010 Upper abdominal pain, unspecified: Secondary | ICD-10-CM | POA: Diagnosis present

## 2014-08-21 DIAGNOSIS — G8929 Other chronic pain: Secondary | ICD-10-CM

## 2014-08-21 DIAGNOSIS — K838 Other specified diseases of biliary tract: Secondary | ICD-10-CM | POA: Diagnosis not present

## 2014-08-21 DIAGNOSIS — R109 Unspecified abdominal pain: Secondary | ICD-10-CM

## 2014-08-21 LAB — COMPREHENSIVE METABOLIC PANEL
ALBUMIN: 4.5 g/dL (ref 3.5–5.2)
ALK PHOS: 65 U/L (ref 39–117)
ALT: 16 U/L (ref 0–35)
AST: 16 U/L (ref 0–37)
BUN: 13 mg/dL (ref 6–23)
CO2: 30 meq/L (ref 19–32)
Calcium: 9.4 mg/dL (ref 8.4–10.5)
Chloride: 101 mEq/L (ref 96–112)
Creatinine, Ser: 0.7 mg/dL (ref 0.40–1.20)
GFR: 91.78 mL/min (ref >60.00)
Glucose, Bld: 97 mg/dL (ref 70–99)
POTASSIUM: 4.2 meq/L (ref 3.5–5.1)
Sodium: 136 mEq/L (ref 135–145)
Total Bilirubin: 0.4 mg/dL (ref 0.2–1.2)
Total Protein: 7.4 g/dL (ref 6.0–8.3)

## 2014-08-21 LAB — CBC WITH DIFFERENTIAL/PLATELET
Basophils Absolute: 0 10*3/uL (ref 0.0–0.1)
Basophils Relative: 0.4 % (ref 0.0–3.0)
EOS PCT: 1.6 % (ref 0.0–5.0)
Eosinophils Absolute: 0.1 10*3/uL (ref 0.0–0.7)
HCT: 40.5 % (ref 36.0–46.0)
HEMOGLOBIN: 13.8 g/dL (ref 12.0–15.0)
Lymphocytes Relative: 32.8 % (ref 12.0–46.0)
Lymphs Abs: 1.6 10*3/uL (ref 0.7–4.0)
MCHC: 34.1 g/dL (ref 30.0–36.0)
MCV: 94 fl (ref 78.0–100.0)
Monocytes Absolute: 0.4 10*3/uL (ref 0.1–1.0)
Monocytes Relative: 7.7 % (ref 3.0–12.0)
NEUTROS ABS: 2.9 10*3/uL (ref 1.4–7.7)
Neutrophils Relative %: 57.5 % (ref 43.0–77.0)
PLATELETS: 258 10*3/uL (ref 150.0–400.0)
RBC: 4.31 Mil/uL (ref 3.87–5.11)
RDW: 14.2 % (ref 11.5–15.5)
WBC: 5 10*3/uL (ref 4.0–10.5)

## 2014-08-21 LAB — LIPASE: Lipase: 12 U/L (ref 11.0–59.0)

## 2014-08-21 LAB — HIGH SENSITIVITY CRP: CRP HIGH SENSITIVITY: 4.38 mg/L (ref 0.000–5.000)

## 2014-08-21 LAB — H. PYLORI ANTIBODY, IGG: H Pylori IgG: NEGATIVE

## 2014-08-21 NOTE — Assessment & Plan Note (Signed)
Asymptomatic.  Exam within normal limits.  Reviewed CT from 12/2012 which had concerns for colitis versus IBD. Will obtain lab workup to include cbc w/ diff, cmp, hs-crp, lipase, h. Pylori igG, and US abdomen.  Will refer to GI based on results and treat accordingly.

## 2014-08-21 NOTE — Patient Instructions (Signed)
Please go to the lab for blood work.   Stop by front desk to schedule Ultrasound. I will call you with your results. If anything is abnormal we will treat accordingly. If all looks good I feel it would be very judicious for us to set you up with Gastroenterology.

## 2014-08-21 NOTE — Progress Notes (Signed)
Patient presents to clinic today to establish care with our office.  Was previously seen by Dr. Ralene Bathe who has moved her practice.   I have reviewed the patient's medical history in detail and updated the computerized patient record.  Patient c/o 1 year intermittent abdominal pain described as dull.  Associated symptoms include nausea and changes to bowels.  Has a recurrent episode of pain 2 weeks ago -- endorses epigastric and RUQ pain associated with loose stools. Denies fever, chills, malaise. Denies melena, hematochezia. Denies acid reflux or heartburn.  Denies cough. Resolved within 48 hours.  Past Medical History  Diagnosis Date  . Seasonal allergies     Current Outpatient Prescriptions on File Prior to Visit  Medication Sig Dispense Refill  . azelastine (OPTIVAR) 0.05 % ophthalmic solution Place 1 drop into both eyes 2 (two) times daily. 6 mL 0  . cholecalciferol (VITAMIN D) 1000 UNITS tablet Take 1,000 Units by mouth daily.    . fluticasone (FLONASE) 50 MCG/ACT nasal spray Place 2 sprays into both nostrils daily. 16 g 0  . Thiamine HCl (VITAMIN B-1) 250 MG tablet Take 250 mg by mouth daily.    . Vitamin D, Ergocalciferol, (DRISDOL) 50000 UNITS CAPS capsule Take one capsule once a week for 12 weeks 12 capsule 0   No current facility-administered medications on file prior to visit.    No Known Allergies  Family History  Problem Relation Age of Onset  . Cancer Maternal Grandmother     breast  . Cancer Mother     ovarian    History   Social History  . Marital Status: Married    Spouse Name: N/A  . Number of Children: N/A  . 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 Smoking status: Former Smoker -- 0.50 packs/day for 25 years    Types: Cigarettes    Quit date: 03/31/2012  . Smokeless tobacco: Never Used  . Alcohol Use: 0.0 oz/week    0 Standard drinks or equivalent per week     Comment: occassional  wine  . Drug Use: No  . Sexual Activity:    Partners: Male   Other Topics Concern  . None   Social History Narrative   Review of Systems - See HPI.  All other ROS are negative.  BP 144/89 mmHg  Pulse 74  Temp(Src) 98 F (36.7 C) (Oral)  Ht 5' 6.75" (1.695 m)  Wt 224 lb 9.6 oz (101.878 kg)  BMI 35.46 kg/m2  SpO2 100%  Physical Exam  Constitutional: She is oriented to person, place, and time and well-developed, well-nourished, and in no distress.  HENT:  Head: Normocephalic and atraumatic.  Cardiovascular: Normal rate, regular rhythm, normal heart sounds and intact distal pulses.   Pulmonary/Chest: Effort normal and breath sounds normal. No respiratory distress. She has no wheezes. She has no rales. She exhibits no tenderness.  Abdominal: Soft. Bowel sounds are normal. She exhibits no distension and no mass. There is no tenderness. There is no rebound and no guarding.  Neurological: She is alert and oriented to person, place, and time.  Skin: Skin is warm and dry. No pallor.  Psychiatric: Affect normal.  Vitals reviewed.   Recent Results (from the past 2160 hour(s))  CBC with Differential/Platelet     Status: None   Collection Time: 07/12/14  1:35 PM  Result Value Ref Range   WBC 7.7 4.0 - 10.5 K/uL  RBC 4.14 3.87 - 5.11 MIL/uL   Hemoglobin 13.3 12.0 - 15.0 g/dL   HCT 39.4 36.0 - 46.0 %   MCV 95.2 78.0 - 100.0 fL   MCH 32.1 26.0 - 34.0 pg   MCHC 33.8 30.0 - 36.0 g/dL   RDW 13.9 11.5 - 15.5 %   Platelets 276 150 - 400 K/uL   MPV 9.5 8.6 - 12.4 fL   Neutrophils Relative % 72 43 - 77 %   Neutro Abs 5.5 1.7 - 7.7 K/uL   Lymphocytes Relative 19 12 - 46 %   Lymphs Abs 1.5 0.7 - 4.0 K/uL   Monocytes Relative 8 3 - 12 %   Monocytes Absolute 0.6 0.1 - 1.0 K/uL   Eosinophils Relative 1 0 - 5 %   Eosinophils Absolute 0.1 0.0 - 0.7 K/uL   Basophils Relative 0 0 - 1 %   Basophils Absolute 0.0 0.0 - 0.1 K/uL   Smear Review Criteria for review not met   COMPLETE METABOLIC  PANEL WITH GFR     Status: None   Collection Time: 07/12/14  1:35 PM  Result Value Ref Range   Sodium 138 135 - 145 mEq/L   Potassium 4.2 3.5 - 5.3 mEq/L   Chloride 102 96 - 112 mEq/L   CO2 24 19 - 32 mEq/L   Glucose, Bld 88 70 - 99 mg/dL   BUN 20 6 - 23 mg/dL   Creat 0.87 0.50 - 1.10 mg/dL   Total Bilirubin 0.4 0.2 - 1.2 mg/dL   Alkaline Phosphatase 66 39 - 117 U/L   AST 14 0 - 37 U/L   ALT 14 0 - 35 U/L   Total Protein 6.9 6.0 - 8.3 g/dL   Albumin 4.3 3.5 - 5.2 g/dL   Calcium 9.5 8.4 - 10.5 mg/dL   GFR, Est African American 86 mL/min   GFR, Est Non African American 75 mL/min    Comment:   The estimated GFR is a calculation valid for adults (>=28 years old) that uses the CKD-EPI algorithm to adjust for age and sex. It is   not to be used for children, pregnant women, hospitalized patients,    patients on dialysis, or with rapidly changing kidney function. According to the NKDEP, eGFR >89 is normal, 60-89 shows mild impairment, 30-59 shows moderate impairment, 15-29 shows severe impairment and <15 is ESRD.     Lipid panel     Status: Abnormal   Collection Time: 07/12/14  1:35 PM  Result Value Ref Range   Cholesterol 199 0 - 200 mg/dL    Comment: ATP III Classification:       < 200        mg/dL        Desirable      200 - 239     mg/dL        Borderline High      >= 240        mg/dL        High      Triglycerides 67 <150 mg/dL   HDL 84 >=46 mg/dL    Comment: ** Please note change in reference range(s). **   Total CHOL/HDL Ratio 2.4 Ratio   VLDL 13 0 - 40 mg/dL   LDL Cholesterol 102 (H) 0 - 99 mg/dL    Comment:   Total Cholesterol/HDL Ratio:CHD Risk  Coronary Heart Disease Risk Table                                        Men       Women          1/2 Average Risk              3.4        3.3              Average Risk              5.0        4.4           2X Average Risk              9.6        7.1           3X Average Risk             23.4        11.0 Use the calculated Patient Ratio above and the CHD Risk table  to determine the patient's CHD Risk. ATP III Classification (LDL):       < 100        mg/dL         Optimal      100 - 129     mg/dL         Near or Above Optimal      130 - 159     mg/dL         Borderline High      160 - 189     mg/dL         High       > 190        mg/dL         Very High     TSH     Status: None   Collection Time: 07/12/14  1:35 PM  Result Value Ref Range   TSH 3.766 0.350 - 4.500 uIU/mL  Vit D  25 hydroxy (rtn osteoporosis monitoring)     Status: Abnormal   Collection Time: 07/12/14  1:35 PM  Result Value Ref Range   Vit D, 25-Hydroxy 17 (L) 30 - 100 ng/mL    Comment: Vitamin D Status           25-OH Vitamin D        Deficiency                <20 ng/mL        Insufficiency         20 - 29 ng/mL        Optimal             > or = 30 ng/mL   For 25-OH Vitamin D testing on patients on D2-supplementation and patients for whom quantitation of D2 and D3 fractions is required, the QuestAssureD 25-OH VIT D, (D2,D3), LC/MS/MS is recommended: order code [phone] (patients > 2 yrs).     Assessment/Plan: Chronic abdominal pain Asymptomatic.  Exam within normal limits.  Reviewed CT from 12/2012 which had concerns for colitis versus IBD. Will obtain lab workup to include cbc w/ diff, cmp, hs-crp, lipase, h. Pylori igG, and US abdomen.  Will refer to GI based on results and treat accordingly.

## 2014-08-21 NOTE — Progress Notes (Signed)
Pre visit review using our clinic review tool, if applicable. No additional management support is needed unless otherwise documented below in the visit note. 

## 2014-08-23 ENCOUNTER — Telehealth: Payer: Self-pay | Admitting: *Deleted

## 2014-08-23 NOTE — Telephone Encounter (Signed)
[facility] @ [phone]) informing the pt that we will give her a call in the morning regarding her US results.//AB/CMA

## 2014-08-25 ENCOUNTER — Ambulatory Visit (HOSPITAL_COMMUNITY)
Admission: RE | Admit: 2014-08-25 | Discharge: 2014-08-25 | Disposition: A | Discharge status: Home / Self Care | Payer: Federal, State, Local not specified - PPO | Source: Ambulatory Visit | Attending: Physician Assistant | Admitting: Physician Assistant

## 2014-08-25 ENCOUNTER — Telehealth: Payer: Self-pay | Admitting: Physician Assistant

## 2014-08-25 DIAGNOSIS — K8689 Other specified diseases of pancreas: Secondary | ICD-10-CM

## 2014-08-25 DIAGNOSIS — K839 Disease of biliary tract, unspecified: Secondary | ICD-10-CM

## 2014-08-25 DIAGNOSIS — R1011 Right upper quadrant pain: Secondary | ICD-10-CM | POA: Insufficient documentation

## 2014-08-25 MED ORDER — GADOBENATE DIMEGLUMINE 529 MG/ML IV SOLN
20.0000 mL | Freq: Once | INTRAVENOUS | Status: AC | PRN
Start: 2014-08-25 — End: 2014-08-25
  Administered 2014-08-25: 20 mL via INTRAVENOUS

## 2014-08-25 NOTE — Telephone Encounter (Signed)
Order placed

## 2014-08-25 NOTE — Telephone Encounter (Signed)
-----   Message from Pryor OchoaElisabeth A Larson, RN sent at 08/24/2014  9:19 AM EDT ----- Notified patient and she stated understanding.  She would like to have the MRCP.

## 2014-08-29 ENCOUNTER — Other Ambulatory Visit (HOSPITAL_BASED_OUTPATIENT_CLINIC_OR_DEPARTMENT_OTHER): Payer: Federal, State, Local not specified - PPO

## 2014-08-30 ENCOUNTER — Telehealth: Payer: Self-pay | Admitting: Physician Assistant

## 2014-08-30 ENCOUNTER — Encounter: Payer: Self-pay | Admitting: Internal Medicine

## 2014-08-30 DIAGNOSIS — R1011 Right upper quadrant pain: Principal | ICD-10-CM

## 2014-08-30 DIAGNOSIS — G8929 Other chronic pain: Secondary | ICD-10-CM

## 2014-08-30 NOTE — Telephone Encounter (Signed)
Referral placed.

## 2014-08-30 NOTE — Telephone Encounter (Signed)
Relation to pt: self  Call back number: [phone]   Reason for call:  Pt requesting x ray results

## 2014-08-30 NOTE — Telephone Encounter (Signed)
-----   Message from Nicole ShireAngela M Baynes, [place] sent at 08/30/2014 12:11 PM EDT ----- Called and spoke with the pt and informed her of recent MRI results and note.  Pt verbalized understanding and agreed to see GI.//AB/CMA

## 2014-08-31 ENCOUNTER — Telehealth: Payer: Self-pay | Admitting: Physician Assistant

## 2014-08-31 NOTE — Telephone Encounter (Signed)
Caller name: Erie NoeVanessa Relation to pt: self Call back number:  [phone] Pharmacy:  Reason for call:   Patient states that she was referred to [facility] and has an appointment for 10/24/14. She does not want to wait that long. She says that she does have a GI dr and is wanting to go to him instead.

## 2014-09-01 NOTE — Telephone Encounter (Signed)
Please advise.//AB/CMA 

## 2014-09-01 NOTE — Telephone Encounter (Signed)
Fine by me 

## 2014-09-01 NOTE — Telephone Encounter (Signed)
Spoke with the pt and informed her that Selena BattenCody said it would be fine the see her GI.  Pt stated that she will call and schedule an appt.//AB/CMA

## 2014-09-04 ENCOUNTER — Telehealth: Payer: Self-pay | Admitting: Physician Assistant

## 2014-09-04 NOTE — Telephone Encounter (Signed)
Relation to pt: self Call back number: [phone]   Reason for call:  Pt requesting RX for endoscopy please fax to digestive health specialist at [address], [place], [place] [address] fax # [phone] and telephone # [phone].

## 2014-09-05 NOTE — Telephone Encounter (Signed)
Marj see below -- referral already in system. Needs to be changed to practice below.  Thank you.

## 2014-09-05 NOTE — Telephone Encounter (Signed)
She needs to see her specialist for assessment and let them decide what testing she needs.

## 2014-09-05 NOTE — Telephone Encounter (Signed)
Spoke with the pt and she need an referral to digestive health specialist.//AB/CMA

## 2014-09-05 NOTE — Telephone Encounter (Signed)
Please advise.//AB/CMA 

## 2014-10-24 ENCOUNTER — Ambulatory Visit: Payer: Federal, State, Local not specified - PPO | Admitting: Internal Medicine

## 2015-05-09 ENCOUNTER — Encounter: Payer: Federal, State, Local not specified - PPO | Admitting: Physician Assistant

## 2015-05-15 ENCOUNTER — Encounter: Payer: Self-pay | Admitting: Behavioral Health

## 2015-05-15 ENCOUNTER — Telehealth: Payer: Self-pay | Admitting: Behavioral Health

## 2015-05-15 NOTE — Telephone Encounter (Signed)
Pre-Visit Call completed with patient and chart updated.   Pre-Visit Info documented in Specialty Comments under Snap[employer]ot.    

## 2015-05-16 ENCOUNTER — Ambulatory Visit (INDEPENDENT_AMBULATORY_CARE_PROVIDER_SITE_OTHER): Payer: Federal, State, Local not specified - PPO | Admitting: Physician Assistant

## 2015-05-16 ENCOUNTER — Telehealth: Payer: Self-pay | Admitting: Physician Assistant

## 2015-05-16 ENCOUNTER — Telehealth: Payer: Self-pay | Admitting: Emergency Medicine

## 2015-05-16 ENCOUNTER — Encounter: Payer: Self-pay | Admitting: Physician Assistant

## 2015-05-16 VITALS — BP 144/92 | HR 61 | Temp 98.3°F | Ht 67.0 in | Wt 231.0 lb

## 2015-05-16 DIAGNOSIS — Z1159 Encounter for screening for other viral diseases: Secondary | ICD-10-CM | POA: Insufficient documentation

## 2015-05-16 DIAGNOSIS — Z0000 Encounter for general adult medical examination without abnormal findings: Secondary | ICD-10-CM | POA: Diagnosis not present

## 2015-05-16 DIAGNOSIS — E669 Obesity, unspecified: Secondary | ICD-10-CM | POA: Insufficient documentation

## 2015-05-16 LAB — CBC
HEMATOCRIT: 38.7 % (ref 36.0–46.0)
HEMOGLOBIN: 13.1 g/dL (ref 12.0–15.0)
MCHC: 34 g/dL (ref 30.0–36.0)
MCV: 94.6 fl (ref 78.0–100.0)
Platelets: 258 10*3/uL (ref 150.0–400.0)
RBC: 4.1 Mil/uL (ref 3.87–5.11)
RDW: 14.4 % (ref 11.5–15.5)
WBC: 5.3 10*3/uL (ref 4.0–10.5)

## 2015-05-16 LAB — COMPREHENSIVE METABOLIC PANEL
ALBUMIN: 4.5 g/dL (ref 3.5–5.2)
ALK PHOS: 59 U/L (ref 39–117)
ALT: 15 U/L (ref 0–35)
AST: 17 U/L (ref 0–37)
BUN: 16 mg/dL (ref 6–23)
CO2: 30 mEq/L (ref 19–32)
Calcium: 9.4 mg/dL (ref 8.4–10.5)
Chloride: 103 mEq/L (ref 96–112)
Creatinine, Ser: 0.68 mg/dL (ref 0.40–1.20)
GFR: 94.66 mL/min (ref >60.00)
Glucose, Bld: 101 mg/dL — ABNORMAL HIGH (ref 70–99)
POTASSIUM: 4.3 meq/L (ref 3.5–5.1)
Sodium: 139 mEq/L (ref 135–145)
Total Bilirubin: 0.5 mg/dL (ref 0.2–1.2)
Total Protein: 7.1 g/dL (ref 6.0–8.3)

## 2015-05-16 LAB — LIPID PANEL
CHOL/HDL RATIO: 2
CHOLESTEROL: 210 mg/dL — AB (ref 0–200)
HDL: 84.4 mg/dL (ref >39.00)
LDL Cholesterol: 114 mg/dL — ABNORMAL HIGH (ref 0–99)
NonHDL: 125.13
TRIGLYCERIDES: 57 mg/dL (ref 0.0–149.0)
VLDL: 11.4 mg/dL (ref 0.0–40.0)

## 2015-05-16 LAB — HEMOGLOBIN A1C: Hgb A1c MFr Bld: 5.7 % (ref 4.6–6.5)

## 2015-05-16 LAB — TSH: TSH: 3.9 u[IU]/mL (ref 0.35–4.50)

## 2015-05-16 MED ORDER — PHENTERMINE-TOPIRAMATE ER 3.75-23 MG PO CP24: 1.0000 | ORAL_CAPSULE | Freq: Every day | ORAL | Status: DC

## 2015-05-16 NOTE — Assessment & Plan Note (Signed)
Discussed options to help with weight loss as patient is on a regular diet and exercise regimen. Will check TSH today. Will attempt trial of Qsymia. Follow-up 1 month.

## 2015-05-16 NOTE — Telephone Encounter (Signed)
Please advise.//AB/CMA 

## 2015-05-16 NOTE — Patient Instructions (Signed)
Please go to the lab for blood work.  I will call you with your results. If your blood work is normal we will follow-up yearly for physicals.  If anything is abnormal we will treat and get you in for follow-up.  Start the Qsymia daily as directed. Let me know if there is an issue with cost. We may have to try plain Phentermine.   Follow-up with me in 1 month.  Preventive Care for Adults, Female A healthy lifestyle and preventive care can promote health and wellness. Preventive health guidelines for women include the following key practices.  A routine yearly physical is a good way to check with your health care provider about your health and preventive screening. It is a chance to share any concerns and updates on your health and to receive a thorough exam.  Visit your dentist for a routine exam and preventive care every 6 months. Brush your teeth twice a day and floss once a day. Good oral hygiene prevents tooth decay and gum disease.  The frequency of eye exams is based on your age, health, family medical history, use of contact lenses, and other factors. Follow your health care provider's recommendations for frequency of eye exams.  Eat a healthy diet. Foods like vegetables, fruits, whole grains, low-fat dairy products, and lean protein foods contain the nutrients you need without too many calories. Decrease your intake of foods high in solid fats, added sugars, and salt. Eat the right amount of calories for you.Get information about a proper diet from your health care provider, if necessary.  Regular physical exercise is one of the most important things you can do for your health. Most adults should get at least 150 minutes of moderate-intensity exercise (any activity that increases your heart rate and causes you to sweat) each week. In addition, most adults need muscle-strengthening exercises on 2 or more days a week.  Maintain a healthy weight. The body mass index (BMI) is a screening  tool to identify possible weight problems. It provides an estimate of body fat based on height and weight. Your health care provider can find your BMI and can help you achieve or maintain a healthy weight.For adults 20 years and older:  A BMI below 18.5 is considered underweight.  A BMI of 18.5 to 24.9 is normal.  A BMI of 25 to 29.9 is considered overweight.  A BMI of 30 and above is considered obese.  Maintain normal blood lipids and cholesterol levels by exercising and minimizing your intake of saturated fat. Eat a balanced diet with plenty of fruit and vegetables. Blood tests for lipids and cholesterol should begin at age 48 and be repeated every 5 years. If your lipid or cholesterol levels are high, you are over 50, or you are at high risk for heart disease, you may need your cholesterol levels checked more frequently.Ongoing high lipid and cholesterol levels should be treated with medicines if diet and exercise are not working.  If you smoke, find out from your health care provider how to quit. If you do not use tobacco, do not start.  Lung cancer screening is recommended for adults aged 36-80 years who are at high risk for developing lung cancer because of a history of smoking. A yearly low-dose CT scan of the lungs is recommended for people who have at least a 30-pack-year history of smoking and are a current smoker or have quit within the past 15 years. A pack year of smoking is smoking an average  of 1 pack of cigarettes a day for 1 year (for example: 1 pack a day for 30 years or 2 packs a day for 15 years). Yearly screening should continue until the smoker has stopped smoking for at least 15 years. Yearly screening should be stopped for people who develop a health problem that would prevent them from having lung cancer treatment.  If you are pregnant, do not drink alcohol. If you are breastfeeding, be very cautious about drinking alcohol. If you are not pregnant and choose to drink  alcohol, do not have more than 1 drink per day. One drink is considered to be 12 ounces (355 mL) of beer, 5 ounces (148 mL) of wine, or 1.5 ounces (44 mL) of liquor.  Avoid use of street drugs. Do not share needles with anyone. Ask for help if you need support or instructions about stopping the use of drugs.  High blood pressure causes heart disease and increases the risk of stroke. Your blood pressure should be checked at least every 1 to 2 years. Ongoing high blood pressure should be treated with medicines if weight loss and exercise do not work.  If you are 62-78 years old, ask your health care provider if you should take aspirin to prevent strokes.  Diabetes screening is done by taking a blood sample to check your blood glucose level after you have not eaten for a certain period of time (fasting). If you are not overweight and you do not have risk factors for diabetes, you should be screened once every 3 years starting at age 17. If you are overweight or obese and you are 63-18 years of age, you should be screened for diabetes every year as part of your cardiovascular risk assessment.  Breast cancer screening is essential preventive care for women. You should practice "breast self-awareness." This means understanding the normal appearance and feel of your breasts and may include breast self-examination. Any changes detected, no matter how small, should be reported to a health care provider. Women in their 73s and 30s should have a clinical breast exam (CBE) by a health care provider as part of a regular health exam every 1 to 3 years. After age 20, women should have a CBE every year. Starting at age 68, women should consider having a mammogram (breast X-ray test) every year. Women who have a family history of breast cancer should talk to their health care provider about genetic screening. Women at a high risk of breast cancer should talk to their health care providers about having an MRI and a  mammogram every year.  Breast cancer gene (BRCA)-related cancer risk assessment is recommended for women who have family members with BRCA-related cancers. BRCA-related cancers include breast, ovarian, tubal, and peritoneal cancers. Having family members with these cancers may be associated with an increased risk for harmful changes (mutations) in the breast cancer genes BRCA1 and BRCA2. Results of the assessment will determine the need for genetic counseling and BRCA1 and BRCA2 testing.  Your health care provider may recommend that you be screened regularly for cancer of the pelvic organs (ovaries, uterus, and vagina). This screening involves a pelvic examination, including checking for microscopic changes to the surface of your cervix (Pap test). You may be encouraged to have this screening done every 3 years, beginning at age 36.  For women ages 11-65, health care providers may recommend pelvic exams and Pap testing every 3 years, or they may recommend the Pap and pelvic exam, combined with testing  for human papilloma virus (HPV), every 5 years. Some types of HPV increase your risk of cervical cancer. Testing for HPV may also be done on women of any age with unclear Pap test results.  Other health care providers may not recommend any screening for nonpregnant women who are considered low risk for pelvic cancer and who do not have symptoms. Ask your health care provider if a screening pelvic exam is right for you.  If you have had past treatment for cervical cancer or a condition that could lead to cancer, you need Pap tests and screening for cancer for at least 20 years after your treatment. If Pap tests have been discontinued, your risk factors (such as having a new sexual partner) need to be reassessed to determine if screening should resume. Some women have medical problems that increase the chance of getting cervical cancer. In these cases, your health care provider may recommend more frequent  screening and Pap tests.  Colorectal cancer can be detected and often prevented. Most routine colorectal cancer screening begins at the age of 11 years and continues through age 69 years. However, your health care provider may recommend screening at an earlier age if you have risk factors for colon cancer. On a yearly basis, your health care provider may provide home test kits to check for hidden blood in the stool. Use of a small camera at the end of a tube, to directly examine the colon (sigmoidoscopy or colonoscopy), can detect the earliest forms of colorectal cancer. Talk to your health care provider about this at age 31, when routine screening begins. Direct exam of the colon should be repeated every 5-10 years through age 14 years, unless early forms of precancerous polyps or small growths are found.  People who are at an increased risk for hepatitis B should be screened for this virus. You are considered at high risk for hepatitis B if:  You were born in a country where hepatitis B occurs often. Talk with your health care provider about which countries are considered high risk.  Your parents were born in a high-risk country and you have not received a shot to protect against hepatitis B (hepatitis B vaccine).  You have HIV or AIDS.  You use needles to inject street drugs.  You live with, or have sex with, someone who has hepatitis B.  You get hemodialysis treatment.  You take certain medicines for conditions like cancer, organ transplantation, and autoimmune conditions.  Hepatitis C blood testing is recommended for all people born from [birth year] through 1965 and any individual with known risks for hepatitis C.  Practice safe sex. Use condoms and avoid high-risk sexual practices to reduce the spread of sexually transmitted infections (STIs). STIs include gonorrhea, chlamydia, syphilis, trichomonas, herpes, HPV, and human immunodeficiency virus (HIV). Herpes, HIV, and HPV are viral illnesses  that have no cure. They can result in disability, cancer, and death.  You should be screened for sexually transmitted illnesses (STIs) including gonorrhea and chlamydia if:  You are sexually active and are younger than 24 years.  You are older than 24 years and your health care provider tells you that you are at risk for this type of infection.  Your sexual activity has changed since you were last screened and you are at an increased risk for chlamydia or gonorrhea. Ask your health care provider if you are at risk.  If you are at risk of being infected with HIV, it is recommended that you take a  prescription medicine daily to prevent HIV infection. This is called preexposure prophylaxis (PrEP). You are considered at risk if:  You are sexually active and do not regularly use condoms or know the HIV status of your partner(s).  You take drugs by injection.  You are sexually active with a partner who has HIV.  Talk with your health care provider about whether you are at high risk of being infected with HIV. If you choose to begin PrEP, you should first be tested for HIV. You should then be tested every 3 months for as long as you are taking PrEP.  Osteoporosis is a disease in which the bones lose minerals and strength with aging. This can result in serious bone fractures or breaks. The risk of osteoporosis can be identified using a bone density scan. Women ages 68 years and over and women at risk for fractures or osteoporosis should discuss screening with their health care providers. Ask your health care provider whether you should take a calcium supplement or vitamin D to reduce the rate of osteoporosis.  Menopause can be associated with physical symptoms and risks. Hormone replacement therapy is available to decrease symptoms and risks. You should talk to your health care provider about whether hormone replacement therapy is right for you.  Use sunscreen. Apply sunscreen liberally and  repeatedly throughout the day. You should seek shade when your shadow is shorter than you. Protect yourself by wearing long sleeves, pants, a wide-brimmed hat, and sunglasses year round, whenever you are outdoors.  Once a month, do a whole body skin exam, using a mirror to look at the skin on your back. Tell your health care provider of new moles, moles that have irregular borders, moles that are larger than a pencil eraser, or moles that have changed in shape or color.  Stay current with required vaccines (immunizations).  Influenza vaccine. All adults should be immunized every year.  Tetanus, diphtheria, and acellular pertussis (Td, Tdap) vaccine. Pregnant women should receive 1 dose of Tdap vaccine during each pregnancy. The dose should be obtained regardless of the length of time since the last dose. Immunization is preferred during the 27th-36th week of gestation. An adult who has not previously received Tdap or who does not know her vaccine status should receive 1 dose of Tdap. This initial dose should be followed by tetanus and diphtheria toxoids (Td) booster doses every 10 years. Adults with an unknown or incomplete history of completing a 3-dose immunization series with Td-containing vaccines should begin or complete a primary immunization series including a Tdap dose. Adults should receive a Td booster every 10 years.  Varicella vaccine. An adult without evidence of immunity to varicella should receive 2 doses or a second dose if she has previously received 1 dose. Pregnant females who do not have evidence of immunity should receive the first dose after pregnancy. This first dose should be obtained before leaving the health care facility. The second dose should be obtained 4-8 weeks after the first dose.  Human papillomavirus (HPV) vaccine. Females aged 13-26 years who have not received the vaccine previously should obtain the 3-dose series. The vaccine is not recommended for use in pregnant  females. However, pregnancy testing is not needed before receiving a dose. If a female is found to be pregnant after receiving a dose, no treatment is needed. In that case, the remaining doses should be delayed until after the pregnancy. Immunization is recommended for any person with an immunocompromised condition through the age of  26 years if she did not get any or all doses earlier. During the 3-dose series, the second dose should be obtained 4-8 weeks after the first dose. The third dose should be obtained 24 weeks after the first dose and 16 weeks after the second dose.  Zoster vaccine. One dose is recommended for adults aged 64 years or older unless certain conditions are present.  Measles, mumps, and rubella (MMR) vaccine. Adults born before [birth year] generally are considered immune to measles and mumps. Adults born in [birth year] or later should have 1 or more doses of MMR vaccine unless there is a contraindication to the vaccine or there is laboratory evidence of immunity to each of the three diseases. A routine second dose of MMR vaccine should be obtained at least 28 days after the first dose for students attending postsecondary schools, health care workers, or international travelers. People who received inactivated measles vaccine or an unknown type of measles vaccine during 1963-1967 should receive 2 doses of MMR vaccine. People who received inactivated mumps vaccine or an unknown type of mumps vaccine before 1979 and are at high risk for mumps infection should consider immunization with 2 doses of MMR vaccine. For females of childbearing age, rubella immunity should be determined. If there is no evidence of immunity, females who are not pregnant should be vaccinated. If there is no evidence of immunity, females who are pregnant should delay immunization until after pregnancy. Unvaccinated health care workers born before [birth year] who lack laboratory evidence of measles, mumps, or rubella immunity or laboratory  confirmation of disease should consider measles and mumps immunization with 2 doses of MMR vaccine or rubella immunization with 1 dose of MMR vaccine.  Pneumococcal 13-valent conjugate (PCV13) vaccine. When indicated, a person who is uncertain of his immunization history and has no record of immunization should receive the PCV13 vaccine. All adults 63 years of age and older should receive this vaccine. An adult aged 19 years or older who has certain medical conditions and has not been previously immunized should receive 1 dose of PCV13 vaccine. This PCV13 should be followed with a dose of pneumococcal polysaccharide (PPSV23) vaccine. Adults who are at high risk for pneumococcal disease should obtain the PPSV23 vaccine at least 8 weeks after the dose of PCV13 vaccine. Adults older than 58 years of age who have normal immune system function should obtain the PPSV23 vaccine dose at least 1 year after the dose of PCV13 vaccine.  Pneumococcal polysaccharide (PPSV23) vaccine. When PCV13 is also indicated, PCV13 should be obtained first. All adults aged 61 years and older should be immunized. An adult younger than age 68 years who has certain medical conditions should be immunized. Any person who resides in a nursing home or long-term care facility should be immunized. An adult smoker should be immunized. People with an immunocompromised condition and certain other conditions should receive both PCV13 and PPSV23 vaccines. People with human immunodeficiency virus (HIV) infection should be immunized as soon as possible after diagnosis. Immunization during chemotherapy or radiation therapy should be avoided. Routine use of PPSV23 vaccine is not recommended for American Indians, [place] Natives, or people younger than 65 years unless there are medical conditions that require PPSV23 vaccine. When indicated, people who have unknown immunization and have no record of immunization should receive PPSV23 vaccine. One-time  revaccination 5 years after the first dose of PPSV23 is recommended for people aged 19-64 years who have chronic kidney failure, nephrotic syndrome, asplenia, or immunocompromised conditions. People who received  1-2 doses of PPSV23 before age 9 years should receive another dose of PPSV23 vaccine at age 25 years or later if at least 5 years have passed since the previous dose. Doses of PPSV23 are not needed for people immunized with PPSV23 at or after age 55 years.  Meningococcal vaccine. Adults with asplenia or persistent complement component deficiencies should receive 2 doses of quadrivalent meningococcal conjugate (MenACWY-D) vaccine. The doses should be obtained at least 2 months apart. Microbiologists working with certain meningococcal bacteria, [place] recruits, people at risk during an outbreak, and people who travel to or live in countries with a high rate of meningitis should be immunized. A first-year college student up through age 31 years who is living in a residence hall should receive a dose if she did not receive a dose on or after her 16th birthday. Adults who have certain high-risk conditions should receive one or more doses of vaccine.  Hepatitis A vaccine. Adults who wish to be protected from this disease, have certain high-risk conditions, work with hepatitis A-infected animals, work in hepatitis A research labs, or travel to or work in countries with a high rate of hepatitis A should be immunized. Adults who were previously unvaccinated and who anticipate close contact with an international adoptee during the first 60 days after arrival in the [place] from a country with a high rate of hepatitis A should be immunized.  Hepatitis B vaccine. Adults who wish to be protected from this disease, have certain high-risk conditions, may be exposed to blood or other infectious body fluids, are household contacts or sex partners of hepatitis B positive people, are clients or workers in  certain care facilities, or travel to or work in countries with a high rate of hepatitis B should be immunized.  Haemophilus influenzae type b (Hib) vaccine. A previously unvaccinated person with asplenia or sickle cell disease or having a scheduled splenectomy should receive 1 dose of Hib vaccine. Regardless of previous immunization, a recipient of a hematopoietic stem cell transplant should receive a 3-dose series 6-12 months after her successful transplant. Hib vaccine is not recommended for adults with HIV infection. Preventive Services / Frequency Ages 41 to 50 years  Blood pressure check.** / Every 3-5 years.  Lipid and cholesterol check.** / Every 5 years beginning at age 35.  Clinical breast exam.** / Every 3 years for women in their 53s and 81s.  BRCA-related cancer risk assessment.** / For women who have family members with a BRCA-related cancer (breast, ovarian, tubal, or peritoneal cancers).  Pap test.** / Every 2 years from ages 63 through 43. Every 3 years starting at age 15 through age 36 or 9 with a history of 3 consecutive normal Pap tests.  HPV screening.** / Every 3 years from ages 61 through ages 83 to 35 with a history of 3 consecutive normal Pap tests.  Hepatitis C blood test.** / For any individual with known risks for hepatitis C.  Skin self-exam. / Monthly.  Influenza vaccine. / Every year.  Tetanus, diphtheria, and acellular pertussis (Tdap, Td) vaccine.** / Consult your health care provider. Pregnant women should receive 1 dose of Tdap vaccine during each pregnancy. 1 dose of Td every 10 years.  Varicella vaccine.** / Consult your health care provider. Pregnant females who do not have evidence of immunity should receive the first dose after pregnancy.  HPV vaccine. / 3 doses over 6 months, if 39 and younger. The vaccine is not recommended for use in pregnant females.  However, pregnancy testing is not needed before receiving a dose.  Measles, mumps, rubella  (MMR) vaccine.** / You need at least 1 dose of MMR if you were born in 1957 or later. You may also need a 2nd dose. For females of childbearing age, rubella immunity should be determined. If there is no evidence of immunity, females who are not pregnant should be vaccinated. If there is no evidence of immunity, females who are pregnant should delay immunization until after pregnancy.  Pneumococcal 13-valent conjugate (PCV13) vaccine.** / Consult your health care provider.  Pneumococcal polysaccharide (PPSV23) vaccine.** / 1 to 2 doses if you smoke cigarettes or if you have certain conditions.  Meningococcal vaccine.** / 1 dose if you are age 22 to 43 years and a Market researcher living in a residence hall, or have one of several medical conditions, you need to get vaccinated against meningococcal disease. You may also need additional booster doses.  Hepatitis A vaccine.** / Consult your health care provider.  Hepatitis B vaccine.** / Consult your health care provider.  Haemophilus influenzae type b (Hib) vaccine.** / Consult your health care provider. Ages 4 to 43 years  Blood pressure check.** / Every year.  Lipid and cholesterol check.** / Every 5 years beginning at age 28 years.  Lung cancer screening. / Every year if you are aged 22-80 years and have a 30-pack-year history of smoking and currently smoke or have quit within the past 15 years. Yearly screening is stopped once you have quit smoking for at least 15 years or develop a health problem that would prevent you from having lung cancer treatment.  Clinical breast exam.** / Every year after age 67 years.  BRCA-related cancer risk assessment.** / For women who have family members with a BRCA-related cancer (breast, ovarian, tubal, or peritoneal cancers).  Mammogram.** / Every year beginning at age 67 years and continuing for as long as you are in good health. Consult with your health care provider.  Pap test.** / Every 3  years starting at age 40 years through age 9 or 41 years with a history of 3 consecutive normal Pap tests.  HPV screening.** / Every 3 years from ages 35 years through ages 3 to 34 years with a history of 3 consecutive normal Pap tests.  Fecal occult blood test (FOBT) of stool. / Every year beginning at age 83 years and continuing until age 58 years. You may not need to do this test if you get a colonoscopy every 10 years.  Flexible sigmoidoscopy or colonoscopy.** / Every 5 years for a flexible sigmoidoscopy or every 10 years for a colonoscopy beginning at age 60 years and continuing until age 31 years.  Hepatitis C blood test.** / For all people born from [birth year] through 1965 and any individual with known risks for hepatitis C.  Skin self-exam. / Monthly.  Influenza vaccine. / Every year.  Tetanus, diphtheria, and acellular pertussis (Tdap/Td) vaccine.** / Consult your health care provider. Pregnant women should receive 1 dose of Tdap vaccine during each pregnancy. 1 dose of Td every 10 years.  Varicella vaccine.** / Consult your health care provider. Pregnant females who do not have evidence of immunity should receive the first dose after pregnancy.  Zoster vaccine.** / 1 dose for adults aged 71 years or older.  Measles, mumps, rubella (MMR) vaccine.** / You need at least 1 dose of MMR if you were born in 1957 or later. You may also need a second dose. For females of  childbearing age, rubella immunity should be determined. If there is no evidence of immunity, females who are not pregnant should be vaccinated. If there is no evidence of immunity, females who are pregnant should delay immunization until after pregnancy.  Pneumococcal 13-valent conjugate (PCV13) vaccine.** / Consult your health care provider.  Pneumococcal polysaccharide (PPSV23) vaccine.** / 1 to 2 doses if you smoke cigarettes or if you have certain conditions.  Meningococcal vaccine.** / Consult your health care  provider.  Hepatitis A vaccine.** / Consult your health care provider.  Hepatitis B vaccine.** / Consult your health care provider.  Haemophilus influenzae type b (Hib) vaccine.** / Consult your health care provider. Ages 65 years and over  Blood pressure check.** / Every year.  Lipid and cholesterol check.** / Every 5 years beginning at age 28 years.  Lung cancer screening. / Every year if you are aged 95-80 years and have a 30-pack-year history of smoking and currently smoke or have quit within the past 15 years. Yearly screening is stopped once you have quit smoking for at least 15 years or develop a health problem that would prevent you from having lung cancer treatment.  Clinical breast exam.** / Every year after age 77 years.  BRCA-related cancer risk assessment.** / For women who have family members with a BRCA-related cancer (breast, ovarian, tubal, or peritoneal cancers).  Mammogram.** / Every year beginning at age 54 years and continuing for as long as you are in good health. Consult with your health care provider.  Pap test.** / Every 3 years starting at age 68 years through age 29 or 64 years with 3 consecutive normal Pap tests. Testing can be stopped between 65 and 70 years with 3 consecutive normal Pap tests and no abnormal Pap or HPV tests in the past 10 years.  HPV screening.** / Every 3 years from ages 98 years through ages 56 or 84 years with a history of 3 consecutive normal Pap tests. Testing can be stopped between 65 and 70 years with 3 consecutive normal Pap tests and no abnormal Pap or HPV tests in the past 10 years.  Fecal occult blood test (FOBT) of stool. / Every year beginning at age 1 years and continuing until age 27 years. You may not need to do this test if you get a colonoscopy every 10 years.  Flexible sigmoidoscopy or colonoscopy.** / Every 5 years for a flexible sigmoidoscopy or every 10 years for a colonoscopy beginning at age 38 years and continuing  until age 47 years.  Hepatitis C blood test.** / For all people born from [birth year] through 1963 and any individual with known risks for hepatitis C.  Osteoporosis screening.** / A one-time screening for women ages 8 years and over and women at risk for fractures or osteoporosis.  Skin self-exam. / Monthly.  Influenza vaccine. / Every year.  Tetanus, diphtheria, and acellular pertussis (Tdap/Td) vaccine.** / 1 dose of Td every 10 years.  Varicella vaccine.** / Consult your health care provider.  Zoster vaccine.** / 1 dose for adults aged 68 years or older.  Pneumococcal 13-valent conjugate (PCV13) vaccine.** / Consult your health care provider.  Pneumococcal polysaccharide (PPSV23) vaccine.** / 1 dose for all adults aged 44 years and older.  Meningococcal vaccine.** / Consult your health care provider.  Hepatitis A vaccine.** / Consult your health care provider.  Hepatitis B vaccine.** / Consult your health care provider.  Haemophilus influenzae type b (Hib) vaccine.** / Consult your health care provider. ** Family history  and personal history of risk and conditions may change your health care provider's recommendations.   This information is not intended to replace advice given to you by your health care provider. Make sure you discuss any questions you have with your health care provider.   Document Released: 05/13/2001 Document Revised: 04/07/2014 Document Reviewed: 08/12/2010 Elsevier Interactive Patient Education [employer].

## 2015-05-16 NOTE — Addendum Note (Signed)
Addended by: Harley Alto on: 05/16/2015 03:40 PM   Modules accepted: Orders

## 2015-05-16 NOTE — Telephone Encounter (Signed)
Lt message for pt to call back. Pt called back and scheduled lab appt for 05/17/15.Marland KitchenMarland KitchenKMP

## 2015-05-16 NOTE — Progress Notes (Signed)
Pre visit review using our clinic review tool, if applicable. No additional management support is needed unless otherwise documented below in the visit note. 

## 2015-05-16 NOTE — Progress Notes (Signed)
Patient presents to clinic today for annual exam.  Patient is fasting for labs.  Acute Concerns: Patient has concerns about obesity. 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 Body mass index is 36.17 kg/(m^2). Patient notes increased weight after menopause despite keeping up with healthy lifestyle and diet.  Health Maintenance: Immunizations -- flu shot negative. Tetanus up-to-date. Colonoscopy -- Up-to-date Mammogram -- Is due for mammogram. Last on 05/12/12. Wants to defer to GYN. PAP -- Is followed by GYN who she defers PAP.   Past Medical History  Diagnosis Date  . Seasonal allergies     Past Surgical History  Procedure Laterality Date  . Elbow surgery      RT  . Mouth surgery      lower jaw gums  . Elbow surgery Right 2015    Second elbow surgery     Current Outpatient Prescriptions on File Prior to Visit  Medication Sig Dispense Refill  . cholecalciferol (VITAMIN D) 1000 UNITS tablet Take 1,000 Units by mouth daily. Reported on 05/15/2015    . fluticasone (FLONASE) 50 MCG/ACT nasal spray Place 2 sprays into both nostrils daily. 16 g 0  . Naproxen Sodium (ALEVE PO) Take 2 tablets by mouth every 12 (twelve) hours.    . Thiamine HCl (VITAMIN B-1) 250 MG tablet Take 250 mg by mouth daily. Reported on 05/15/2015     No current facility-administered medications on file prior to visit.    No Known Allergies  Family History  Problem Relation Age of Onset  . Cancer Maternal Grandmother     breast  . Cancer Mother     ovarian    Social History   Social History  . Marital Status: Married    Spouse Name: N/A  . Number of Children: N/A  . 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 Smoking status: Former Smoker -- 0.50 packs/day for 25 years    Types: Cigarettes    Quit date: 03/31/2012  . Smokeless tobacco: Never Used  . Alcohol Use: 0.0 oz/week    0 Standard  drinks or equivalent per week     Comment: occassional wine  . Drug Use: No  . Sexual Activity:    Partners: Male   Other Topics Concern  . Not on file   Social History Narrative   Review of Systems  Constitutional: Negative for fever and weight loss.  HENT: Negative for ear discharge, ear pain, hearing loss and tinnitus.   Eyes: Negative for blurred vision, double vision, photophobia and pain.  Respiratory: Negative for cough and shortness of breath.   Cardiovascular: Negative for chest pain and palpitations.  Gastrointestinal: Negative for heartburn, nausea, vomiting, abdominal pain, diarrhea, constipation, blood in stool and melena.  Genitourinary: Negative for dysuria, urgency, frequency, hematuria and flank pain.  Musculoskeletal: Negative for falls.  Neurological: Negative for dizziness, loss of consciousness and headaches.  Endo/Heme/Allergies: Negative for environmental allergies.  Psychiatric/Behavioral: Negative for depression, suicidal ideas, hallucinations and substance abuse. The patient is not nervous/anxious and does not have insomnia.     BP 144/92 mmHg  Pulse 61  Temp(Src) 98.3 F (36.8 C) (Oral)  Ht  (1.702 m)  Wt 231 lb (104.781 kg)  BMI 36.17 kg/m2  SpO2 98%  Physical Exam  Constitutional: She is oriented to person, place, and time and well-developed, well-nourished, and in no distress.  HENT:  Head: Normocephalic and atraumatic.  Right Ear: Tympanic membrane, external ear and ear canal normal.  Left Ear: Tympanic membrane, external ear and ear canal normal.  Nose: Nose normal. No mucosal edema.  Mouth/Throat: Uvula is midline, oropharynx is clear and moist and mucous membranes are normal. No oropharyngeal exudate or posterior oropharyngeal erythema.  Eyes: Conjunctivae are normal. Pupils are equal, round, and reactive to light.  Neck: Neck supple. No thyromegaly present.  Cardiovascular: Normal rate, regular rhythm, normal heart sounds and intact  distal pulses.   Pulmonary/Chest: Effort normal and breath sounds normal. No respiratory distress. She has no wheezes. She has no rales.  Abdominal: Soft. Bowel sounds are normal. She exhibits no distension and no mass. There is no tenderness. There is no rebound and no guarding.  Lymphadenopathy:    She has no cervical adenopathy.  Neurological: She is alert and oriented to person, place, and time. No cranial nerve deficit.  Skin: Skin is warm and dry. No rash noted.  Psychiatric: Affect normal.  Vitals reviewed.   No results found for this or any previous visit (from the past 2160 hour(s)).  Assessment/Plan: Visit for preventive health examination Depression screen negative. Health Maintenance reviewed -- Declines flu. Tetanus up-to-date. Due for mammogram and PAP but defers to GYN. Is going to see the practice across the hall. Preventive schedule discussed and handout given in AVS. Will obtain fasting labs today.   Need for hepatitis C screening test Hep C antibody ordered after discussion of pros to testing. Patient is aware of some insurances not paying for screening. She wishes to proceed with screening without checking coverage. States she will pay out of pocket if needed.  Obesity (BMI 30-39.9) Discussed options to help with weight loss as patient is on a regular diet and exercise regimen. Will check TSH today. Will attempt trial of Qsymia. Follow-up 1 month.

## 2015-05-16 NOTE — Assessment & Plan Note (Signed)
Depression screen negative. Health Maintenance reviewed -- Declines flu. Tetanus up-to-date. Due for mammogram and PAP but defers to GYN. Is going to see the practice across the hall. Preventive schedule discussed and handout given in AVS. Will obtain fasting labs today.

## 2015-05-16 NOTE — Telephone Encounter (Signed)
Pt says that her insurance will not cover the Rx provided today at her appt Phentermine-Topiramate. She says that PCP advised that he would prescribe something else if insurance wouldn't cover. Pt would like to be advised further.    CB: [phone]

## 2015-05-16 NOTE — Assessment & Plan Note (Signed)
Hep C antibody ordered after discussion of pros to testing. Patient is aware of some insurances not paying for screening. She wishes to proceed with screening without checking coverage. States she will pay out of pocket if needed.

## 2015-05-16 NOTE — Telephone Encounter (Signed)
So reviewing her insurance and looking at other options -- it seems they are likely not going to cover any of the meds (according to what the EMR shows me). I would recommend she call her insurance rep and ask what medications are covered of cheapest for weight loss and give me a call so I know what to give.

## 2015-05-16 NOTE — Telephone Encounter (Signed)
Called and [facility] @ 1:36pm @ [phone]) asking the pt to RTC regarding note below.//AB/CMA

## 2015-05-17 ENCOUNTER — Other Ambulatory Visit: Payer: Federal, State, Local not specified - PPO

## 2015-05-17 LAB — HEPATITIS C ANTIBODY: HCV Ab: NEGATIVE

## 2015-05-18 ENCOUNTER — Other Ambulatory Visit (INDEPENDENT_AMBULATORY_CARE_PROVIDER_SITE_OTHER): Payer: Federal, State, Local not specified - PPO

## 2015-05-18 DIAGNOSIS — Z0000 Encounter for general adult medical examination without abnormal findings: Secondary | ICD-10-CM | POA: Diagnosis not present

## 2015-05-18 MED ORDER — TOPIRAMATE 25 MG PO CPSP: 25.0000 mg | ORAL_CAPSULE | Freq: Two times a day (BID) | ORAL | Status: DC

## 2015-05-18 MED ORDER — PHENTERMINE HCL 15 MG PO CAPS: 15.0000 mg | ORAL_CAPSULE | ORAL | Status: DC

## 2015-05-18 MED FILL — PHENTERMINE 15 MG CAPSULE: 15 | 30 days supply | Qty: 30 | Fill #0

## 2015-05-18 MED FILL — TOPIRAMATE 25 MG TABLET: 25 | 30 days supply | Qty: 60 | Fill #0

## 2015-05-18 NOTE — Telephone Encounter (Signed)
Called and spoke with the pt on (05/16/15) and informed her of the note below.  Pt verbalized understanding.  Pt stated that she was informed by her pharmacy that the Qsymia has an discount program.  Pt stated that she looked into it and it will be a 10 dollar difference,and she is willing to pay the difference.  So she would like to try the discount program.  Pt stated that she would like for me to asked Selena Batten to see what he thought about it.  Per provider he would still like for her to called her insurance rep and ask what medications are covered of cheapest for weight loss.  The cheapest may be better for her.  Pt verbalized understanding and agreed.//AB/CMA

## 2015-05-18 NOTE — Addendum Note (Signed)
Addended by: Marcelline Mates on: 05/18/2015 03:03 PM   Modules accepted: Orders

## 2015-05-18 NOTE — Telephone Encounter (Signed)
Pt calling back about meds. Please call her at [phone]

## 2015-05-18 NOTE — Telephone Encounter (Signed)
Called and spoke with the pt and she stated that she find out from the insurance if she has a written prescription for both the Phentermine and Topiramate, then all she will have to pay is a $10.00 co-pay for both.   Verbally informed Nicole Haney of the note and per provider he will write for both the prescriptions.  Prescriptions written and printed and left up front for the pt to pick -up.//AB/CMA

## 2015-05-19 LAB — URINALYSIS, MICROSCOPIC ONLY
Bacteria, UA: NONE SEEN [HPF]
CRYSTALS: NONE SEEN [HPF]
Casts: NONE SEEN [LPF]
RBC / HPF: NONE SEEN RBC/HPF (ref <=2)
SQUAMOUS EPITHELIAL / LPF: NONE SEEN [HPF] (ref <=5)
Yeast: NONE SEEN [HPF]

## 2015-05-19 LAB — URINALYSIS, ROUTINE W REFLEX MICROSCOPIC
Bilirubin Urine: NEGATIVE
Glucose, UA: NEGATIVE
Hgb urine dipstick: NEGATIVE
Ketones, ur: NEGATIVE
Nitrite: NEGATIVE
Protein, ur: NEGATIVE
SPECIFIC GRAVITY, URINE: 1.006 (ref 1.001–1.035)
pH: 6.5 (ref 5.0–8.0)

## 2015-05-21 ENCOUNTER — Other Ambulatory Visit: Payer: Self-pay | Admitting: Physician Assistant

## 2015-05-21 ENCOUNTER — Encounter: Payer: Self-pay | Admitting: *Deleted

## 2015-05-21 MED ORDER — TOPIRAMATE 25 MG PO CPSP: 25.0000 mg | ORAL_CAPSULE | Freq: Two times a day (BID) | ORAL | Status: DC

## 2015-06-18 ENCOUNTER — Other Ambulatory Visit: Payer: Self-pay | Admitting: Physician Assistant

## 2015-06-18 MED FILL — TOPIRAMATE 25 MG TABLET: 25 | 30 days supply | Qty: 60 | Fill #0

## 2015-06-18 MED FILL — PHENTERMINE 15 MG CAPSULE: 15 | 30 days supply | Qty: 30 | Fill #0

## 2015-06-18 NOTE — Telephone Encounter (Signed)
Last OV 05/16/15 Phentermine last filled 05/16/15 #30 with 0 topamax last filled 05/21/15 #60 with 0

## 2015-07-30 ENCOUNTER — Other Ambulatory Visit: Payer: Self-pay | Admitting: Physician Assistant

## 2015-07-30 MED ORDER — PHENTERMINE HCL 15 MG PO CAPS: 15.0000 mg | ORAL_CAPSULE | Freq: Every morning | ORAL | Status: DC

## 2015-07-30 MED FILL — PHENTERMINE 15 MG CAPSULE: 15 | 30 days supply | Qty: 30 | Fill #0

## 2015-07-30 MED FILL — TOPIRAMATE 25 MG TABLET: 25 | 30 days supply | Qty: 60 | Fill #0

## 2015-07-30 NOTE — Telephone Encounter (Signed)
Refill sent per [facility] refill protocol/SLS  

## 2015-07-30 NOTE — Addendum Note (Signed)
Addended by: Regis BillSCATES, Nolan Lasser L on: 07/30/2015 08:12 AM   Modules accepted: Orders

## 2015-08-07 DIAGNOSIS — K080 Exfoliation of teeth due to systemic causes: Secondary | ICD-10-CM | POA: Diagnosis not present

## 2015-08-08 DIAGNOSIS — K080 Exfoliation of teeth due to systemic causes: Secondary | ICD-10-CM | POA: Diagnosis not present

## 2015-08-30 DIAGNOSIS — K080 Exfoliation of teeth due to systemic causes: Secondary | ICD-10-CM | POA: Diagnosis not present

## 2016-02-12 DIAGNOSIS — K080 Exfoliation of teeth due to systemic causes: Secondary | ICD-10-CM | POA: Diagnosis not present

## 2016-09-22 ENCOUNTER — Ambulatory Visit (INDEPENDENT_AMBULATORY_CARE_PROVIDER_SITE_OTHER): Payer: Federal, State, Local not specified - PPO | Admitting: Obstetrics & Gynecology

## 2016-09-22 ENCOUNTER — Ambulatory Visit (HOSPITAL_BASED_OUTPATIENT_CLINIC_OR_DEPARTMENT_OTHER)
Admission: RE | Admit: 2016-09-22 | Discharge: 2016-09-22 | Disposition: A | Discharge status: Home / Self Care | Payer: Federal, State, Local not specified - PPO | Source: Ambulatory Visit | Attending: Obstetrics & Gynecology | Admitting: Obstetrics & Gynecology

## 2016-09-22 ENCOUNTER — Encounter: Payer: Self-pay | Admitting: Obstetrics & Gynecology

## 2016-09-22 VITALS — BP 135/88 | HR 73 | Ht 67.5 in | Wt 238.0 lb

## 2016-09-22 DIAGNOSIS — Z1151 Encounter for screening for human papillomavirus (HPV): Secondary | ICD-10-CM | POA: Diagnosis not present

## 2016-09-22 DIAGNOSIS — F5105 Insomnia due to other mental disorder: Secondary | ICD-10-CM

## 2016-09-22 DIAGNOSIS — Z1231 Encounter for screening mammogram for malignant neoplasm of breast: Secondary | ICD-10-CM | POA: Insufficient documentation

## 2016-09-22 DIAGNOSIS — Z124 Encounter for screening for malignant neoplasm of cervix: Secondary | ICD-10-CM | POA: Diagnosis not present

## 2016-09-22 DIAGNOSIS — Z1239 Encounter for other screening for malignant neoplasm of breast: Secondary | ICD-10-CM

## 2016-09-22 DIAGNOSIS — N951 Menopausal and female climacteric states: Secondary | ICD-10-CM

## 2016-09-22 DIAGNOSIS — Z01419 Encounter for gynecological examination (general) (routine) without abnormal findings: Secondary | ICD-10-CM | POA: Diagnosis not present

## 2016-09-22 DIAGNOSIS — R32 Unspecified urinary incontinence: Secondary | ICD-10-CM

## 2016-09-22 MED ORDER — CONJ ESTROG-MEDROXYPROGEST ACE 0.625-2.5 MG PO TABS: 1.0000 | ORAL_TABLET | Freq: Every day | ORAL | 3 refills | Status: DC

## 2016-09-22 NOTE — Patient Instructions (Signed)
Menopause Menopause is the normal time of life when menstrual periods stop completely. Menopause is complete when you have missed 12 consecutive menstrual periods. It usually occurs between the ages of 48 years and 55 years. Very rarely does a woman develop menopause before the age of 40 years. At menopause, your ovaries stop producing the female hormones estrogen and progesterone. This can cause undesirable symptoms and also affect your health. Sometimes the symptoms may occur 4-5 years before the menopause begins. There is no relationship between menopause and:  Oral contraceptives.  Number of children you had.  Race.  The age your menstrual periods started (menarche).  Heavy smokers and very thin women may develop menopause earlier in life. What are the causes?  The ovaries stop producing the female hormones estrogen and progesterone. Other causes include:  Surgery to remove both ovaries.  The ovaries stop functioning for no known reason.  Tumors of the pituitary gland in the brain.  Medical disease that affects the ovaries and hormone production.  Radiation treatment to the abdomen or pelvis.  Chemotherapy that affects the ovaries.  What are the signs or symptoms?  Hot flashes.  Night sweats.  Decrease in sex drive.  Vaginal dryness and thinning of the vagina causing painful intercourse.  Dryness of the skin and developing wrinkles.  Headaches.  Tiredness.  Irritability.  Memory problems.  Weight gain.  Bladder infections.  Hair growth of the face and chest.  Infertility. More serious symptoms include:  Loss of bone (osteoporosis) causing breaks (fractures).  Depression.  Hardening and narrowing of the arteries (atherosclerosis) causing heart attacks and strokes.  How is this diagnosed?  When the menstrual periods have stopped for 12 straight months.  Physical exam.  Hormone studies of the blood. How is this treated? There are many treatment  choices and nearly as many questions about them. The decisions to treat or not to treat menopausal changes is an individual choice made with your health care provider. Your health care provider can discuss the treatments with you. Together, you can decide which treatment will work best for you. Your treatment choices may include:  Hormone therapy (estrogen and progesterone).  Non-hormonal medicines.  Treating the individual symptoms with medicine (for example antidepressants for depression).  Herbal medicines that may help specific symptoms.  Counseling by a psychiatrist or psychologist.  Group therapy.  Lifestyle changes including: ? Eating healthy. ? Regular exercise. ? Limiting caffeine and alcohol. ? Stress management and meditation.  No treatment.  Follow these instructions at home:  Take the medicine your health care provider gives you as directed.  Get plenty of sleep and rest.  Exercise regularly.  Eat a diet that contains calcium (good for the bones) and soy products (acts like estrogen hormone).  Avoid alcoholic beverages.  Do not smoke.  If you have hot flashes, dress in layers.  Take supplements, calcium, and vitamin D to strengthen bones.  You can use over-the-counter lubricants or moisturizers for vaginal dryness.  Group therapy is sometimes very helpful.  Acupuncture may be helpful in some cases. Contact a health care provider if:  You are not sure you are in menopause.  You are having menopausal symptoms and need advice and treatment.  You are still having menstrual periods after age 55 years.  You have pain with intercourse.  Menopause is complete (no menstrual period for 12 months) and you develop vaginal bleeding.  You need a referral to a specialist (gynecologist, psychiatrist, or psychologist) for treatment. Get help right   away if:  You have severe depression.  You have excessive vaginal bleeding.  You fell and think you have a  broken bone.  You have pain when you urinate.  You develop leg or chest pain.  You have a fast pounding heart beat (palpitations).  You have severe headaches.  You develop vision problems.  You feel a lump in your breast.  You have abdominal pain or severe indigestion. This information is not intended to replace advice given to you by your health care provider. Make sure you discuss any questions you have with your health care provider. Document Released: 06/07/2003 Document Revised: 08/23/2015 Document Reviewed: 10/14/2012 Elsevier Interactive Patient Education  2017 Else[employer] Inc[employer]  

## 2016-09-22 NOTE — Progress Notes (Signed)
Subjective:     Nicole Haney is a 61 y.o. female here for a routine exam.  [id number] LMP 5-8 ears prev.  Current complaints: Pt reports that she is having issues with menopause. She reports weight gain. She does reports hot flushes and insomnia.due to night sweats.  Pt reports anxiety repeated to lack of sleep.    Pt used ot sleep 7-8 hours per night. Now she is sleeping on yl 2 hours at a time. Pt reports not being sexually due to issue with husband. Pt reports stress incontinence. Pt reports that she can live with her sx. Would not consider surgery. Pt reports that she doesn't want a pill either.   Pts mother died of cervical cancer- undiagnosed and untreated. Pt MGM died of breast cancer- undiagnosed. Did have chemo but, it was late.  Age 61's       Gynecologic History No LMP recorded. Patient is postmenopausal. Contraception: post menopausal status and tubal ligation Last Pap: 05/2012. Results were: normal Last mammogram: 05/2012. Results were: normal  Obstetric History OB History  Gravida Para Term Preterm AB Living  4 2     2 2   SAB TAB Ectopic Multiple Live Births  2            # Outcome Date GA Lbr Len/2nd Weight Sex Delivery Anes PTL Lv  4 SAB           3 SAB           2 Para      Vag-Spont     1 Para      Vag-Spont        The following portions of the patient's history were reviewed and updated as appropriate: allergies, current medications, past family history, past medical history, past social history, past surgical history and problem list.  Review of Systems Pertinent items are noted in HPI.    Objective:  BP 135/88 (BP Location: Left Arm)   Pulse 73   Ht 5' 7.5" (1.715 m)   Wt 238 lb (108 kg)   BMI 36.73 kg/m  General Appearance:    Alert, cooperative, no distress, appears stated age  Head:    Normocephalic, without obvious abnormality, atraumatic  Eyes:    conjunctiva/corneas clear, EOM's intact, both eyes  Ears:    Normal external ear canals, both ears   Nose:   Nares normal, septum midline, mucosa normal, no drainage    or sinus tenderness  Throat:   Lips, mucosa, and tongue normal; teeth and gums normal  Neck:   Supple, symmetrical, trachea midline, no adenopathy;    thyroid:  no enlargement/tenderness/nodules  Back:     Symmetric, no curvature, ROM normal, no CVA tenderness  Lungs:     Clear to auscultation bilaterally, respirations unlabored  Chest Wall:    No tenderness or deformity   Heart:    Regular rate and rhythm, S1 and S2 normal, no murmur, rub   or gallop  Breast Exam:    No tenderness, masses, or nipple abnormality  Abdomen:     Soft, non-tender, bowel sounds active all four quadrants,    no masses, no organomegaly  Genitalia:    Normal female without lesion, discharge or tenderness; atrophy noted     Extremities:   Extremities normal, atraumatic, no cyanosis or edema  Pulses:   2+ and symmetric all extremities  Skin:   Skin color, texture, turgor normal, no rashes or lesions     Assessment:  Healthy female exam.   menopausal state Insomnia Incontinence- stress; no treatment requested at this time    Plan:    Mammogram ordered. Follow up in: 6 weeks.     Patient with bothersome menopausal vasomotor symptoms. Discussed lifestyle interventions such as wearing light clothing, remaining in cool environments, having fan/air conditioner in the room, avoiding hot beverages etc.  Discussed using hormone therapy and concerns about increased risk of heart disease, cerebrovascular disease, thromboembolic disease,  and breast cancer.  Also discussed other medical options such as Paxil, Effexor or Neurontin.   Also discussed alternative therapies such as herbal remedies but cautioned that most of the products contained phytoestrogens (plant estrogens) in unregulated amounts which can have the same effects on the body as the pharmaceutical estrogen preparations.  Also referred her to www.menopause.org for other alternative options.   Patient opted for Prempro 0.625/2.5.estrogen therapy for now, wants to try the oral formulation.  She will return in 6 weeks for reevaluation.  Javarus Dorner L. Harraway-Smith, M.D., [facility]

## 2016-09-24 LAB — CYTOLOGY - PAP
Diagnosis: NEGATIVE
HPV: NOT DETECTED

## 2016-10-07 ENCOUNTER — Telehealth: Payer: Self-pay

## 2016-10-07 DIAGNOSIS — N951 Menopausal and female climacteric states: Secondary | ICD-10-CM

## 2016-10-07 DIAGNOSIS — F5105 Insomnia due to other mental disorder: Secondary | ICD-10-CM

## 2016-10-07 DIAGNOSIS — R32 Unspecified urinary incontinence: Secondary | ICD-10-CM

## 2016-10-07 MED ORDER — CONJ ESTROG-MEDROXYPROGEST ACE 0.625-2.5 MG PO TABS: 1.0000 | ORAL_TABLET | Freq: Every day | ORAL | 3 refills | Status: DC

## 2016-10-07 NOTE — Telephone Encounter (Signed)
Patient called stating that script for estrogen has expired at the pharmacy and would like to start the medication. Resent prescription to [facility] in [place]. Armandina StammerJennifer Howard RNBSN

## 2016-11-10 DIAGNOSIS — K080 Exfoliation of teeth due to systemic causes: Secondary | ICD-10-CM | POA: Diagnosis not present

## 2016-11-26 ENCOUNTER — Ambulatory Visit (INDEPENDENT_AMBULATORY_CARE_PROVIDER_SITE_OTHER): Payer: Federal, State, Local not specified - PPO | Admitting: Obstetrics & Gynecology

## 2016-11-26 ENCOUNTER — Encounter: Payer: Self-pay | Admitting: Obstetrics & Gynecology

## 2016-11-26 VITALS — BP 111/74 | HR 83 | Ht 67.5 in | Wt 240.0 lb

## 2016-11-26 DIAGNOSIS — Z79890 Hormone replacement therapy (postmenopausal): Secondary | ICD-10-CM | POA: Diagnosis not present

## 2016-11-26 NOTE — Progress Notes (Signed)
History:  61 y.o. [id number] here today for f/u of menopausal state. Pt started on Prempro in July. Pt started spotting after starting the Prempro. The bleeding is very light 2x/ week.  NO bleeding prior to ERT. The hot flushes are completely gone. Pt sleeping well. Pt is not sexually active. No leakage of urine.      The following portions of the patient's history were reviewed and updated as appropriate: allergies, current medications, past family history, past medical history, past social history, past surgical history and problem list.  Review of Systems:  Pertinent items are noted in HPI.   Objective:  Physical Exam Blood pressure 111/74, pulse 83, height 5' 7.5" (1.715 m), weight 240 lb (108.9 kg). CONSTITUTIONAL: Well-developed, well-nourished female in no acute distress.  HENT:  Normocephalic, atraumatic EYES: Conjunctivae and EOM are normal. No scleral icterus.  NECK: Normal range of motion SKIN: Skin is warm and dry. No rash noted. Not diaphoretic.No pallor. NEUROLGIC: Alert and oriented to person, place, and time. Normal coordination.  GYN exam: deferred    Assessment & Plan:  Menopausal state/hot flushes- improved on ERT. Spotting on ERT. This is considered WNL given that she is on estrogen and Progesterone. Pt does not want to change regimen at present.    Prempro 0.625/2.5mg  F/u 3 months  If spotting does not improve rec US to eval endometrial stripe.  Total face-to-face time with patient was 15 min.  Greater than 50% was spent in counseling and coordination of care with the patient.    Anu Stagner L. Harraway-Smith, M.D., [facility] CoreFACOG

## 2017-06-01 ENCOUNTER — Other Ambulatory Visit: Payer: Self-pay

## 2017-06-01 DIAGNOSIS — R32 Unspecified urinary incontinence: Secondary | ICD-10-CM

## 2017-06-01 DIAGNOSIS — F5105 Insomnia due to other mental disorder: Secondary | ICD-10-CM

## 2017-06-01 DIAGNOSIS — N951 Menopausal and female climacteric states: Secondary | ICD-10-CM

## 2017-06-01 MED ORDER — CONJ ESTROG-MEDROXYPROGEST ACE 0.625-2.5 MG PO TABS: 1.0000 | ORAL_TABLET | Freq: Every day | ORAL | 2 refills | Status: DC

## 2018-05-06 DIAGNOSIS — K080 Exfoliation of teeth due to systemic causes: Secondary | ICD-10-CM | POA: Diagnosis not present

## 2018-09-14 DIAGNOSIS — D485 Neoplasm of uncertain behavior of skin: Secondary | ICD-10-CM | POA: Diagnosis not present

## 2018-10-18 DIAGNOSIS — W19XXXA Unspecified fall, initial encounter: Secondary | ICD-10-CM | POA: Diagnosis not present

## 2018-10-18 DIAGNOSIS — Z79899 Other long term (current) drug therapy: Secondary | ICD-10-CM | POA: Diagnosis not present

## 2018-10-18 DIAGNOSIS — J3489 Other specified disorders of nose and nasal sinuses: Secondary | ICD-10-CM | POA: Diagnosis not present

## 2018-10-18 DIAGNOSIS — Z043 Encounter for examination and observation following other accident: Secondary | ICD-10-CM | POA: Diagnosis not present

## 2018-10-18 DIAGNOSIS — S022XXA Fracture of nasal bones, initial encounter for closed fracture: Secondary | ICD-10-CM | POA: Diagnosis not present

## 2018-10-18 DIAGNOSIS — Z87891 Personal history of nicotine dependence: Secondary | ICD-10-CM | POA: Diagnosis not present

## 2018-10-18 DIAGNOSIS — R42 Dizziness and giddiness: Secondary | ICD-10-CM | POA: Diagnosis not present

## 2018-10-18 DIAGNOSIS — G8911 Acute pain due to trauma: Secondary | ICD-10-CM | POA: Diagnosis not present

## 2018-10-18 DIAGNOSIS — W228XXA Striking against or struck by other objects, initial encounter: Secondary | ICD-10-CM | POA: Diagnosis not present

## 2018-10-18 DIAGNOSIS — R51 Headache: Secondary | ICD-10-CM | POA: Diagnosis not present

## 2018-10-18 MED ORDER — IOPAMIDOL (ISOVUE-370) INJECTION 76%
100.00 | INTRAVENOUS | Status: DC
Start: ? — End: 2018-10-18

## 2018-10-20 ENCOUNTER — Telehealth: Payer: Self-pay | Admitting: Physician Assistant

## 2018-10-20 NOTE — Telephone Encounter (Signed)
l °

## 2018-10-20 NOTE — Telephone Encounter (Signed)
Please advise 

## 2018-10-20 NOTE — Telephone Encounter (Signed)
Patient has not been seen in > 3 years so she is considered a new patient. Does not need any permission to establish with Dr. Nani Ravens.

## 2018-10-20 NOTE — Telephone Encounter (Signed)
OK w me.  

## 2018-10-20 NOTE — Telephone Encounter (Signed)
Please advise if you are ok with the transfer    Copied from [place] [id number]. Topic: Appointment Scheduling - Transfer of Care >> Oct 20, 2018  1:41 PM Lennox Solders wrote: Pt is requesting to transfer FROM: Nicole Haney Pt is requesting to transfer TO:dr wendling Reason for requested transfer: pt does not want to drive to [place] Send CRM to patient's current PCP (transferring FROM).

## 2018-10-21 NOTE — Telephone Encounter (Signed)
Called pt left msg to call back and schedule an appointment

## 2018-10-21 NOTE — Telephone Encounter (Signed)
Can scheduler reach out to pt to set up new pt appt with Dr Nani Ravens?

## 2018-10-22 NOTE — Telephone Encounter (Signed)
Pt returned call.  Tried office 3x.

## 2018-10-25 NOTE — Telephone Encounter (Signed)
LVM for pt to schedule a new pt appt.

## 2018-10-26 ENCOUNTER — Other Ambulatory Visit: Payer: Self-pay

## 2018-10-26 ENCOUNTER — Telehealth: Payer: Self-pay

## 2018-10-26 NOTE — Telephone Encounter (Signed)
LM for pt to call and schedule appt. °

## 2018-10-26 NOTE — Telephone Encounter (Signed)
Copied from [place] [id number]. Topic: General - Other >> Oct 26, 2018  8:56 AM Keene Breath wrote: Reason for CRM: Patient called to schedule a TOC appt. With Dr. Nani Ravens.  CB# [phone]

## 2018-11-03 ENCOUNTER — Encounter: Payer: Self-pay | Admitting: Family Medicine

## 2018-11-03 ENCOUNTER — Other Ambulatory Visit: Payer: Self-pay

## 2018-11-03 ENCOUNTER — Ambulatory Visit: Payer: Federal, State, Local not specified - PPO | Admitting: Family Medicine

## 2018-11-03 VITALS — BP 109/80 | HR 92 | Temp 99.3°F | Ht 67.5 in | Wt 245.5 lb

## 2018-11-03 DIAGNOSIS — J0100 Acute maxillary sinusitis, unspecified: Secondary | ICD-10-CM | POA: Diagnosis not present

## 2018-11-03 MED ORDER — AMOXICILLIN-POT CLAVULANATE 875-125 MG PO TABS
1.0000 | ORAL_TABLET | Freq: Two times a day (BID) | ORAL | 0 refills | Status: AC
Start: 2018-11-03 — End: 2018-11-10

## 2018-11-03 NOTE — Progress Notes (Signed)
Chief Complaint  Patient presents with  . New Patient (Initial Visit)  . Sinusitis       New Patient Visit SUBJECTIVE: HPI: Nicole Haney is an 63 y.o.female who is being seen for re-establishing care.  Duration: 3 weeks  Associated symptoms: sinus congestion, sinus pain and rhinorrhea Denies: itchy watery eyes, ear pain, ear drainage, sore throat, wheezing, shortness of breath, myalgia, cough, and fevers Treatment to date: warm compresses, Alka Seltzer plus Sick contacts: No  No Known Allergies  Past Medical History:  Diagnosis Date  . Seasonal allergies    Past Surgical History:  Procedure Laterality Date  . ELBOW SURGERY     RT  . ELBOW SURGERY Right 2015   Second elbow surgery   . MOUTH SURGERY     lower jaw gums   Family History  Problem Relation Age of Onset  . Cancer Mother        ovarian  . Cancer Maternal Grandmother        breast   No Known Allergies  Current Outpatient Medications:  .  cholecalciferol (VITAMIN D) 1000 UNITS tablet, Take 1,000 Units by mouth daily. Reported on 05/15/2015, Disp: , Rfl:  .  amoxicillin-clavulanate (AUGMENTIN) 875-125 MG tablet, Take 1 tablet by mouth 2 (two) times daily for 7 days., Disp: 14 tablet, Rfl: 0  ROS HEENT: +congestion  Const: Denies fevers   OBJECTIVE: BP 109/80 (BP Location: Left Arm, Patient Position: Sitting, Cuff Size: Large)   Pulse 92   Temp 99.3 F (37.4 C) (Oral)   Ht 5' 7.5" (1.715 m)   Wt 245 lb 8 oz (111.4 kg)   SpO2 95%   BMI 37.88 kg/m   Constitutional: -  VS reviewed -  Well developed, well nourished, appears stated age -  No apparent distress  Psychiatric: -  Oriented to person, place, and time -  Memory intact -  Affect and mood normal -  Fluent conversation, good eye contact -  Judgment and insight age appropriate  Eye: -  Conjunctivae clear, no discharge -  Pupils symmetric, round, reactive to light  ENMT: -  +R sided max ttp -  Ears neg b/l -  MMM    Pharynx moist, no  exudate, no erythema  Neck: -  No gross swelling, no palpable masses -  Thyroid midline, not enlarged, mobile, no palpable masses  Cardiovascular: -  RRR -  No LE edema  Respiratory: -  Normal respiratory effort, no accessory muscle use, no retraction -  Breath sounds equal, no wheezes, no ronchi, no crackles  Skin: -  No significant lesion on inspection -  Warm and dry to palpation   ASSESSMENT/PLAN: Acute non-recurrent maxillary sinusitis - Plan: amoxicillin-clavulanate (AUGMENTIN) 875-125 MG tablet, given duration, will tx. Ice, Tylenol. Warning signs and symptoms verbalized and written down in AVS.   Patient should return for CPE prn. The patient voiced understanding and agreement to the plan.   [place], DO 11/03/18  11:08 AM

## 2018-11-03 NOTE — Patient Instructions (Addendum)
Continue to push fluids, practice good hand hygiene, and cover your mouth if you cough.  If you start having fevers, shaking or shortness of breath, seek immediate care.  OK to take Tylenol 1000 mg (2 extra strength tabs) or 975 mg (3 regular strength tabs) every 6 hours as needed.  Ice/cold pack over area for 10-15 min twice daily.  Let us know if you need anything.

## 2018-11-15 ENCOUNTER — Other Ambulatory Visit: Payer: Self-pay

## 2018-11-15 ENCOUNTER — Encounter: Payer: Self-pay | Admitting: Family Medicine

## 2018-11-15 ENCOUNTER — Ambulatory Visit (INDEPENDENT_AMBULATORY_CARE_PROVIDER_SITE_OTHER): Payer: Federal, State, Local not specified - PPO | Admitting: Family Medicine

## 2018-11-15 VITALS — BP 120/80 | HR 93 | Temp 98.0°F | Ht 67.5 in | Wt 248.5 lb

## 2018-11-15 DIAGNOSIS — Z114 Encounter for screening for human immunodeficiency virus [HIV]: Secondary | ICD-10-CM

## 2018-11-15 DIAGNOSIS — Z0000 Encounter for general adult medical examination without abnormal findings: Secondary | ICD-10-CM | POA: Diagnosis not present

## 2018-11-15 DIAGNOSIS — Z1239 Encounter for other screening for malignant neoplasm of breast: Secondary | ICD-10-CM | POA: Diagnosis not present

## 2018-11-15 DIAGNOSIS — Z1159 Encounter for screening for other viral diseases: Secondary | ICD-10-CM | POA: Diagnosis not present

## 2018-11-15 LAB — COMPREHENSIVE METABOLIC PANEL
ALT: 17 U/L (ref 0–35)
AST: 12 U/L (ref 0–37)
Albumin: 4.5 g/dL (ref 3.5–5.2)
Alkaline Phosphatase: 74 U/L (ref 39–117)
BUN: 16 mg/dL (ref 6–23)
CO2: 27 mEq/L (ref 19–32)
Calcium: 9.4 mg/dL (ref 8.4–10.5)
Chloride: 105 mEq/L (ref 96–112)
Creatinine, Ser: 0.76 mg/dL (ref 0.40–1.20)
GFR: 77.39 mL/min (ref >60.00)
Glucose, Bld: 103 mg/dL — ABNORMAL HIGH (ref 70–99)
Potassium: 4.3 mEq/L (ref 3.5–5.1)
Sodium: 140 mEq/L (ref 135–145)
Total Bilirubin: 0.3 mg/dL (ref 0.2–1.2)
Total Protein: 7.1 g/dL (ref 6.0–8.3)

## 2018-11-15 LAB — LIPID PANEL
Cholesterol: 204 mg/dL — ABNORMAL HIGH (ref 0–200)
HDL: 64.1 mg/dL (ref >39.00)
LDL Cholesterol: 120 mg/dL — ABNORMAL HIGH (ref 0–99)
NonHDL: 140.23
Total CHOL/HDL Ratio: 3
Triglycerides: 102 mg/dL (ref 0.0–149.0)
VLDL: 20.4 mg/dL (ref 0.0–40.0)

## 2018-11-15 LAB — CBC
HCT: 39.5 % (ref 36.0–46.0)
Hemoglobin: 13.4 g/dL (ref 12.0–15.0)
MCHC: 34 g/dL (ref 30.0–36.0)
MCV: 94.4 fl (ref 78.0–100.0)
Platelets: 231 10*3/uL (ref 150.0–400.0)
RBC: 4.18 Mil/uL (ref 3.87–5.11)
RDW: 13.3 % (ref 11.5–15.5)
WBC: 5.5 10*3/uL (ref 4.0–10.5)

## 2018-11-15 NOTE — Patient Instructions (Addendum)
Give us 2-3 business days to get the results of your labs back.   Keep the diet clean and stay active.  The new Shingrix vaccine (for shingles) is a 2 shot series. It can make people feel low energy, achy and almost like they have the flu for 48 hours after injection. Please plan accordingly when deciding on when to get this shot. Call our office for a nurse visit appointment to get this. The second shot of the series is less severe regarding the side effects, but it still lasts 48 hours.   I recommend getting the flu shot in mid October. This suggestion would change if the CDC comes out with a different recommendation.   Let us know if you need anything.  

## 2018-11-15 NOTE — Progress Notes (Signed)
Chief Complaint  Patient presents with  . Annual Exam     Well Woman Nicole Haney is here for a complete physical.   Her last physical was >1 year ago.  Current diet: in general, a "healthy" diet. Current exercise: walking. Weight is stable and she denies daytime fatigue. No LMP recorded. Patient is postmenopausal. Seatbelt? Yes  Health Maintenance Pap/HPV- Yes Mammogram- No Tetanus- Yes  Hep C screening- No HIV screening- No  Past Medical History:  Diagnosis Date  . Seasonal allergies      Past Surgical History:  Procedure Laterality Date  . ELBOW SURGERY     RT  . ELBOW SURGERY Right 2015   Second elbow surgery   . MOUTH SURGERY     lower jaw gums    Medications  Current Outpatient Medications on File Prior to Visit  Medication Sig Dispense Refill  . cholecalciferol (VITAMIN D) 1000 UNITS tablet Take 1,000 Units by mouth daily. Reported on 05/15/2015     Allergies No Known Allergies  Review of Systems: Constitutional:  no unexpected weight changes Eye:  no recent significant change in vision Ear/Nose/Mouth/Throat:  Ears:  no tinnitus or vertigo and no recent change in hearing Nose/Mouth/Throat:  no complaints of nasal congestion, no sore throat Cardiovascular: no chest pain Respiratory:  no cough and no shortness of breath Gastrointestinal:  no abdominal pain, no change in bowel habits GU:  Female: negative for dysuria or pelvic pain Musculoskeletal/Extremities:  no pain of the joints Integumentary (Skin/Breast):  no abnormal skin lesions reported Neurologic:  no headaches Endocrine:  denies fatigue Hematologic/Lymphatic:  No areas of easy bleeding  Exam BP 120/80 (BP Location: Left Arm, Patient Position: Sitting, Cuff Size: Normal)   Pulse 93   Temp 98 F (36.7 C) (Temporal)   Ht 5' 7.5" (1.715 m)   Wt 248 lb 8 oz (112.7 kg)   SpO2 96%   BMI 38.35 kg/m  General:  well developed, well nourished, in no apparent distress Skin:  no significant  moles, warts, or growths Head:  no masses, lesions, or tenderness Eyes:  pupils equal and round, sclera anicteric without injection Ears:  canals without lesions, TMs shiny without retraction, no obvious effusion, no erythema Nose:  nares patent, septum midline, mucosa normal, and no drainage or sinus tenderness Throat/Pharynx:  lips and gingiva without lesion; tongue and uvula midline; non-inflamed pharynx; no exudates or postnasal drainage Neck: neck supple without adenopathy, thyromegaly, or masses Lungs:  clear to auscultation, breath sounds equal bilaterally, no respiratory distress Cardio:  regular rate and rhythm, no bruits, no LE edema Abdomen:  abdomen soft, nontender; bowel sounds normal; no masses or organomegaly Genital: Defer to GYN Musculoskeletal:  symmetrical muscle groups noted without atrophy or deformity Extremities:  no clubbing, cyanosis, or edema, no deformities, no skin discoloration Neuro:  gait normal; deep tendon reflexes normal and symmetric Psych: well oriented with normal range of affect and appropriate judgment/insight  Assessment and Plan  Well adult exam - Plan: CBC, Lipid panel, Comprehensive metabolic panel  Encounter for hepatitis C screening test for low risk patient - Plan: Hepatitis C antibody  Screening for HIV (human immunodeficiency virus) - Plan: HIV Antibody (routine testing w rflx)  Screening for malignant neoplasm of breast - Plan: MM DIGITAL SCREENING BILATERAL   Well 63 y.o. female. Counseled on diet and exercise. Other orders as above. Follow up in 1 yr or prn. The patient voiced understanding and agreement to the plan.  [place], DO  11/15/18 2:57 PM

## 2018-11-17 LAB — HEPATITIS C ANTIBODY
Hepatitis C Ab: NONREACTIVE
SIGNAL TO CUT-OFF: 0.01 (ref <1.00)

## 2018-11-17 LAB — HIV ANTIBODY (ROUTINE TESTING W REFLEX): HIV 1&2 Ab, 4th Generation: NONREACTIVE

## 2019-01-18 ENCOUNTER — Encounter (HOSPITAL_BASED_OUTPATIENT_CLINIC_OR_DEPARTMENT_OTHER): Payer: Self-pay

## 2019-01-18 ENCOUNTER — Other Ambulatory Visit: Payer: Self-pay

## 2019-01-18 ENCOUNTER — Ambulatory Visit (HOSPITAL_BASED_OUTPATIENT_CLINIC_OR_DEPARTMENT_OTHER)
Admission: RE | Admit: 2019-01-18 | Discharge: 2019-01-18 | Disposition: A | Discharge status: Home / Self Care | Payer: Federal, State, Local not specified - PPO | Source: Ambulatory Visit | Attending: Family Medicine | Admitting: Family Medicine

## 2019-01-18 DIAGNOSIS — Z1231 Encounter for screening mammogram for malignant neoplasm of breast: Secondary | ICD-10-CM | POA: Insufficient documentation

## 2019-01-18 DIAGNOSIS — Z1239 Encounter for other screening for malignant neoplasm of breast: Secondary | ICD-10-CM

## 2019-01-19 ENCOUNTER — Ambulatory Visit (INDEPENDENT_AMBULATORY_CARE_PROVIDER_SITE_OTHER): Payer: Federal, State, Local not specified - PPO | Admitting: Obstetrics & Gynecology

## 2019-01-19 ENCOUNTER — Encounter: Payer: Self-pay | Admitting: Obstetrics & Gynecology

## 2019-01-19 VITALS — BP 114/75 | HR 88 | Ht 67.5 in | Wt 246.0 lb

## 2019-01-19 DIAGNOSIS — Z01419 Encounter for gynecological examination (general) (routine) without abnormal findings: Secondary | ICD-10-CM

## 2019-01-19 DIAGNOSIS — Z1151 Encounter for screening for human papillomavirus (HPV): Secondary | ICD-10-CM | POA: Diagnosis not present

## 2019-01-19 DIAGNOSIS — Z124 Encounter for screening for malignant neoplasm of cervix: Secondary | ICD-10-CM

## 2019-01-19 DIAGNOSIS — N393 Stress incontinence (female) (male): Secondary | ICD-10-CM

## 2019-01-19 NOTE — Patient Instructions (Signed)

## 2019-01-19 NOTE — Progress Notes (Signed)
Subjective:     Nicole Haney is a 63 y.o. female here for a routine exam. [id number] LMP >10 year  Current complaints: none. Recently had her general PE. NO GYN issues. Stoped the ERt 1 year prev due to weight gain that was unexplained. She reports ~ 1 hot flush per month. Pt reports that she is having occ leakage of urine with laughing or coughing. It is not a problem that she wants to eval.        Gynecologic History No LMP recorded. Patient is postmenopausal. Contraception: post menopausal status Last Pap: 08/2016. Results were: normal Last mammogram: 01/18/2019. Results were: normal  Obstetric History OB History  Gravida Para Term Preterm AB Living  4 2     2 2   SAB TAB Ectopic Multiple Live Births  2            # Outcome Date GA Lbr Len/2nd Weight Sex Delivery Anes PTL Lv  4 SAB           3 SAB           2 Para      Vag-Spont     1 Para      Vag-Spont       The following portions of the patient's history were reviewed and updated as appropriate: allergies, current medications, past family history, past medical history, past social history, past surgical history and problem list.  Review of Systems Pertinent items are noted in HPI.    Objective:  BP 114/75   Pulse 88   Ht 5' 7.5" (1.715 m)   Wt 246 lb (111.6 kg)   BMI 37.96 kg/m   General Appearance:    Alert, cooperative, no distress, appears stated age  Head:    Normocephalic, without obvious abnormality, atraumatic  Eyes:    conjunctiva/corneas clear, EOM's intact, both eyes  Ears:    Normal external ear canals, both ears  Nose:   Nares normal, septum midline, mucosa normal, no drainage    or sinus tenderness  Throat:   Lips, mucosa, and tongue normal; teeth and gums normal  Neck:   Supple, symmetrical, trachea midline, no adenopathy;    thyroid:  no enlargement/tenderness/nodules  Back:     Symmetric, no curvature, ROM normal, no CVA tenderness  Lungs:     Clear to auscultation bilaterally, respirations unlabored   Chest Wall:    No tenderness or deformity   Heart:    Regular rate and rhythm, S1 and S2 normal, no murmur, rub   or gallop  Breast Exam:    No tenderness, masses, or nipple abnormality; extremely ticklish with exam    Abdomen:     Soft, non-tender, bowel sounds active all four quadrants,    no masses, no organomegaly  Genitalia:    Normal female without lesion, discharge or tenderness; uterus small mobile     Extremities:   Extremities normal, atraumatic, no cyanosis or edema  Pulses:   2+ and symmetric all extremities  Skin:   Skin color, texture, turgor normal, no rashes or lesions    Assessment:    Healthy female exam.   Stress urinary incontinence discussed stepes to relieve sx.    Plan:    f/u PAP with hrHPV Annual mammogram [place] prn (info sheet given to pt with AVS) F/u in 1 year or sooner prn   Alexsis Branscom L. Harraway-Smith, M.D., Cherlynn June

## 2019-01-19 NOTE — Addendum Note (Signed)
Addended by: Valentina Lucks on: 01/19/2019 03:32 PM   Modules accepted: Orders

## 2019-01-20 ENCOUNTER — Ambulatory Visit (HOSPITAL_BASED_OUTPATIENT_CLINIC_OR_DEPARTMENT_OTHER): Payer: Federal, State, Local not specified - PPO

## 2019-01-21 LAB — CYTOLOGY - PAP
Comment: NEGATIVE
Diagnosis: NEGATIVE
High risk HPV: NEGATIVE

## 2019-05-31 ENCOUNTER — Encounter: Payer: Self-pay | Admitting: Family Medicine

## 2019-05-31 ENCOUNTER — Ambulatory Visit (INDEPENDENT_AMBULATORY_CARE_PROVIDER_SITE_OTHER): Payer: Federal, State, Local not specified - PPO | Admitting: Family Medicine

## 2019-05-31 ENCOUNTER — Other Ambulatory Visit: Payer: Self-pay

## 2019-05-31 DIAGNOSIS — J0140 Acute pansinusitis, unspecified: Secondary | ICD-10-CM

## 2019-05-31 MED ORDER — PREDNISONE 20 MG PO TABS: 40.0000 mg | ORAL_TABLET | Freq: Every day | ORAL | 0 refills | Status: AC

## 2019-05-31 MED ORDER — AMOXICILLIN-POT CLAVULANATE 875-125 MG PO TABS: 1.0000 | ORAL_TABLET | Freq: Two times a day (BID) | ORAL | 0 refills | Status: AC

## 2019-05-31 NOTE — Progress Notes (Signed)
CC: Sinusitis  Nicole Haney here for URI complaints. Due to COVID-19 pandemic, we are interacting via web portal for an electronic face-to-face visit. I verified patient's ID using 2 identifiers. Patient agreed to proceed with visit via this method. Patient is at home, I am at office. Patient and I are present for visit.   Duration: 6 days, worsening since Sunday Associated symptoms: sinus congestion, sinus pain, sore throat and cough Denies: rhinorrhea, itchy watery eyes, ear pain, ear drainage, sore throat, wheezing, shortness of breath, myalgia and fevers Treatment to date: salt water gargles, Aleve, Mucinex Sick contacts: No  ROS:  Const: Denies fevers HEENT: As noted in HPI Lungs: No SOB  Past Medical History:  Diagnosis Date  . Seasonal allergies    Exam No conversational dyspnea Age appropriate judgment and insight Nml affect and mood  Acute pansinusitis, recurrence not specified - Plan: predniSONE (DELTASONE) 20 MG tablet, amoxicillin-clavulanate (AUGMENTIN) 875-125 MG tablet  Pred as allergies likely contributing. Pocket rx w 7 d of Augmentin.  Continue to push fluids, practice good hand hygiene, cover mouth when coughing. F/u prn. If starting to experience fevers, shaking, or shortness of breath, seek immediate care. Pt voiced understanding and agreement to the plan.  Jilda Roche [place], DO 05/31/19 2:46 PM

## 2019-12-26 DIAGNOSIS — Z20828 Contact with and (suspected) exposure to other viral communicable diseases: Secondary | ICD-10-CM | POA: Diagnosis not present

## 2020-12-04 IMAGING — MG DIGITAL SCREENING BILAT W/ TOMO W/ CAD
8 series · 8 of 24 positions shown · non-contrast
Comparison: Previous exam(s).

CLINICAL DATA: Screening.

EXAM:
DIGITAL SCREENING BILATERAL MAMMOGRAM WITH TOMO AND CAD

[R CC synth-2D]
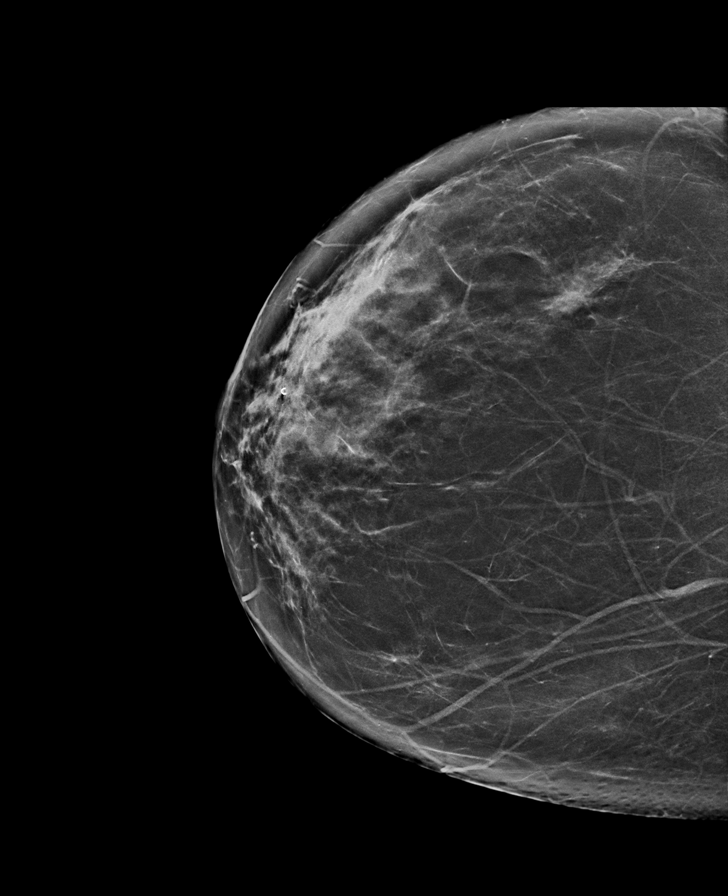

[R MLO synth-2D]
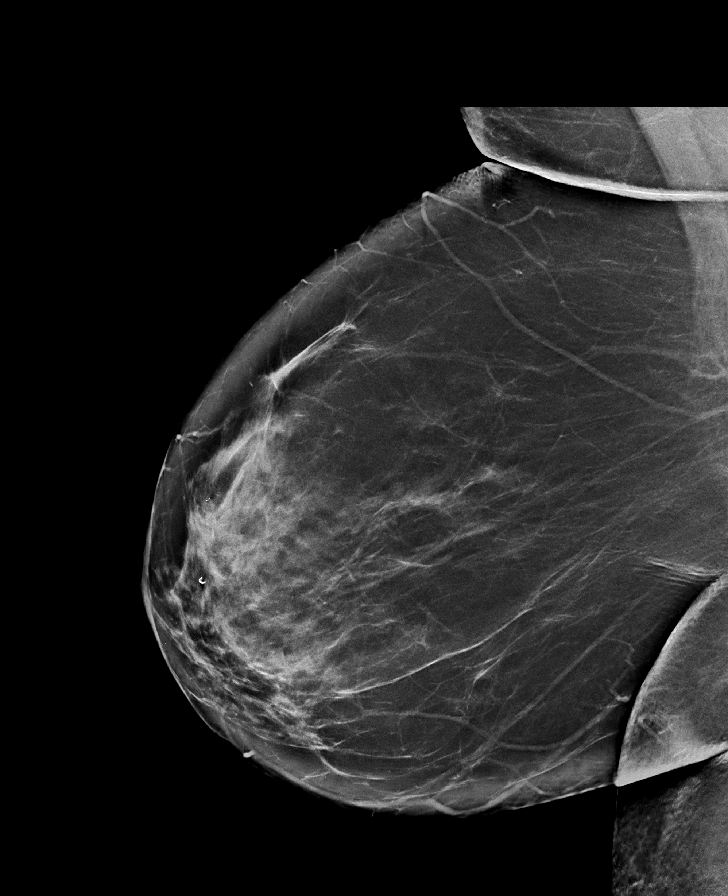

[L MLO synth-2D]
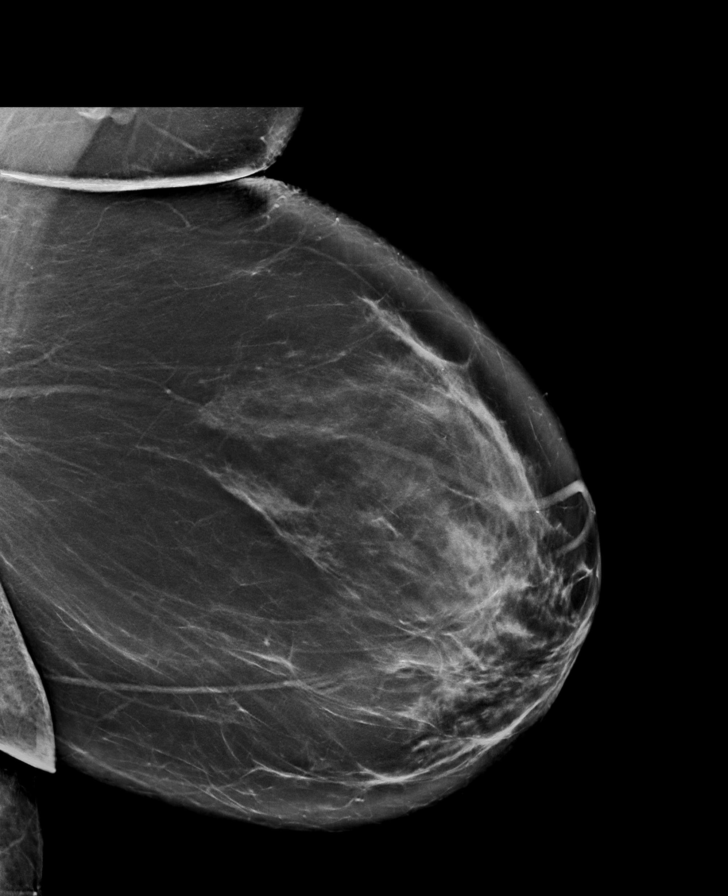

[L CC synth-2D]
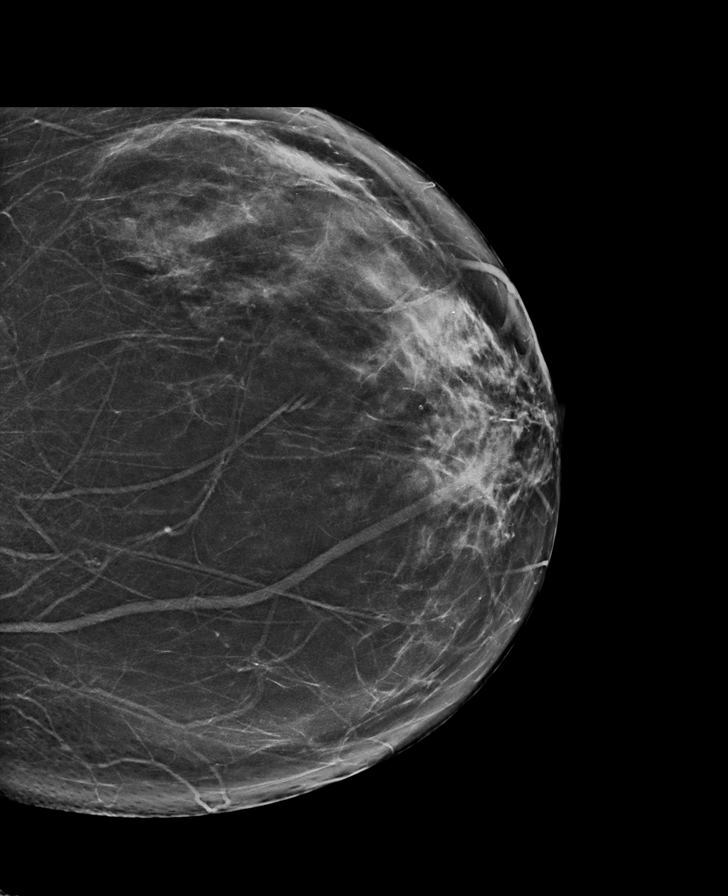

[R CC tomo · tomo slice 37/72.0]
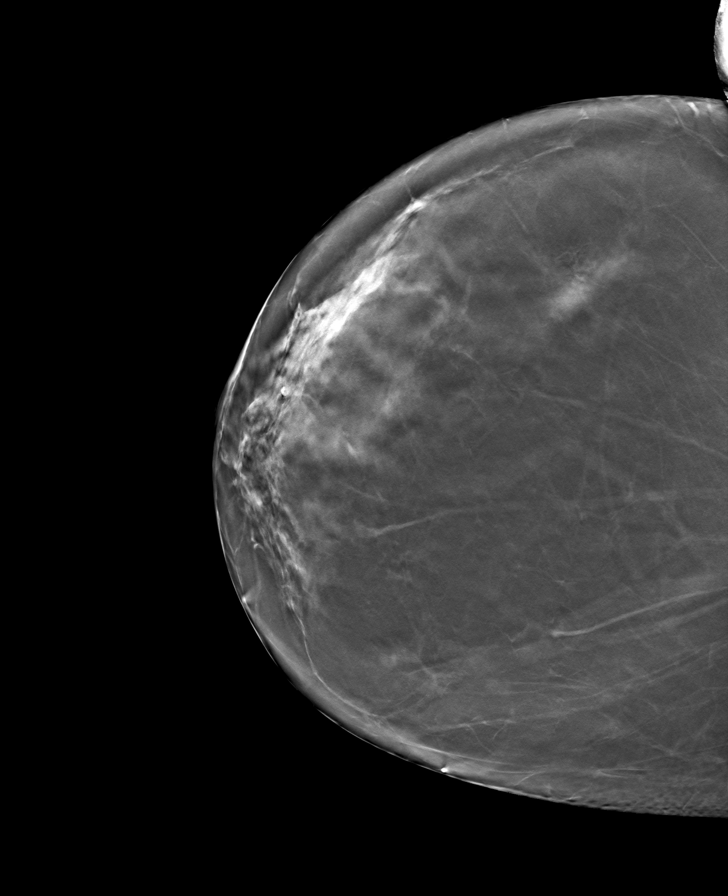

[L MLO tomo · tomo slice 49/96.0]
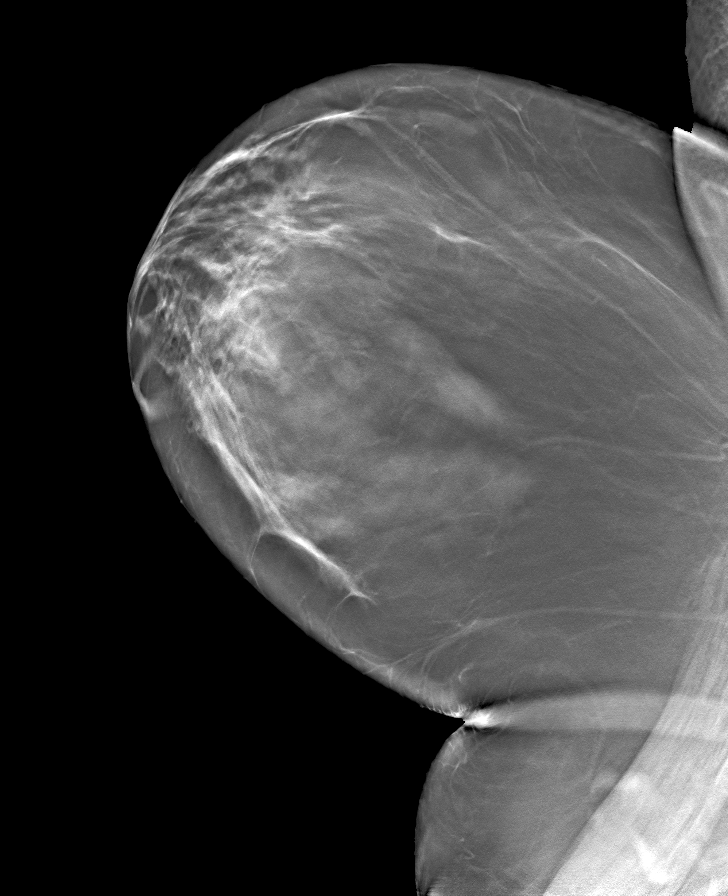

[R MLO tomo · tomo slice 45/88.0]
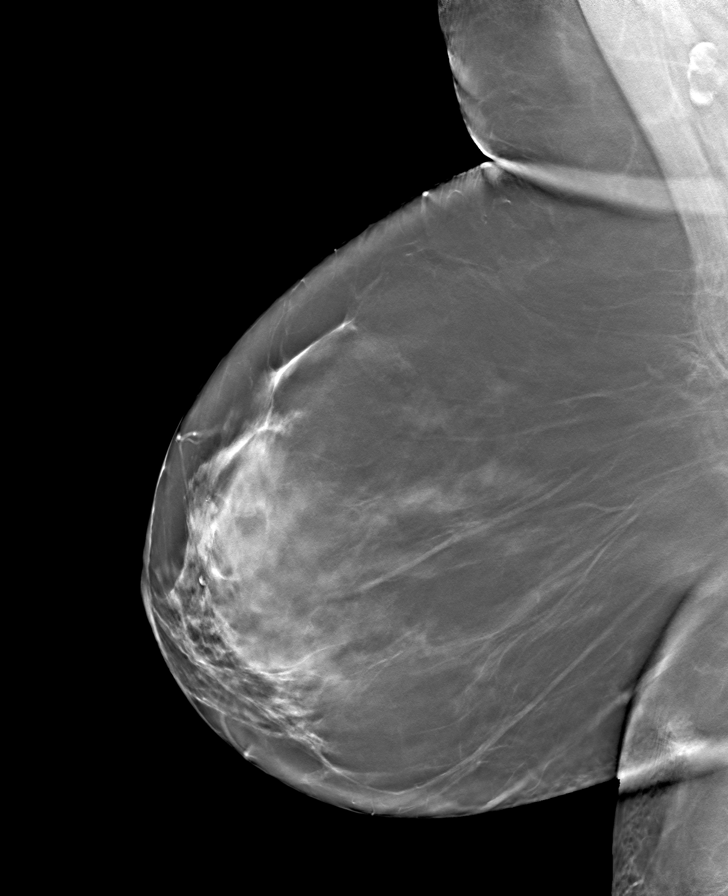

[L CC tomo · tomo slice 39/76.0]
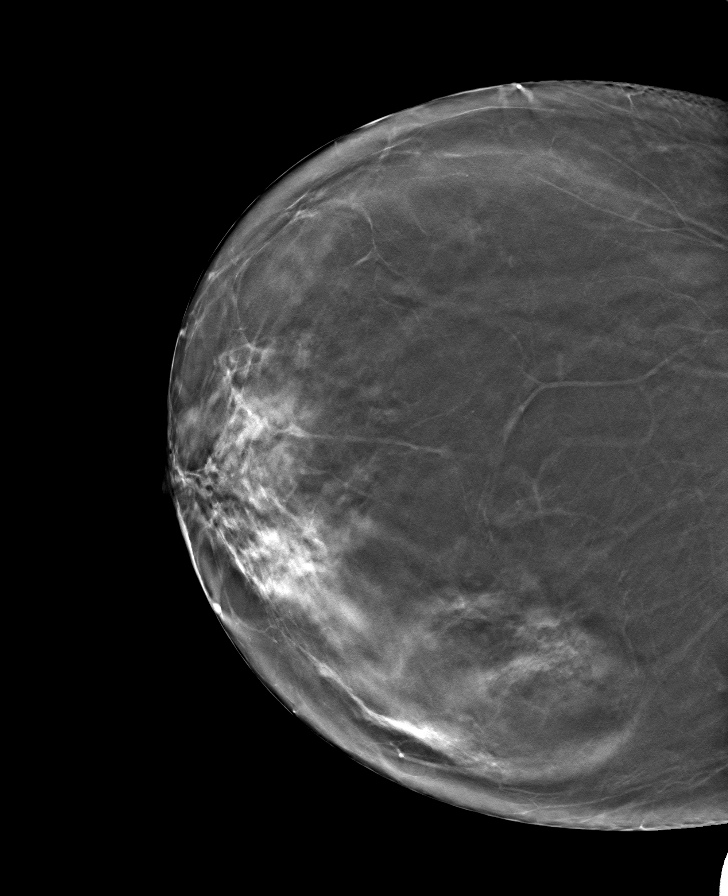

[8 of 24 positions shown; findings below may reference images not displayed]

ACR Breast Density Category c: The breast tissue is heterogeneously
dense, which may obscure small masses.
FINDINGS: There are no findings suspicious for malignancy. Images were
processed with CAD.
IMPRESSION: No mammographic evidence of malignancy. A result letter of this
screening mammogram will be mailed directly to the patient.

RECOMMENDATION:
Screening mammogram in one year. (Code:[id number])

BI-RADS CATEGORY  1: Negative.

## 2021-09-09 ENCOUNTER — Ambulatory Visit (INDEPENDENT_AMBULATORY_CARE_PROVIDER_SITE_OTHER): Payer: Federal, State, Local not specified - PPO | Admitting: Family Medicine

## 2021-09-09 ENCOUNTER — Encounter: Payer: Self-pay | Admitting: Family Medicine

## 2021-09-09 VITALS — BP 118/82 | HR 76 | Temp 97.7°F | Ht 67.5 in | Wt 256.1 lb

## 2021-09-09 DIAGNOSIS — Z0000 Encounter for general adult medical examination without abnormal findings: Secondary | ICD-10-CM

## 2021-09-09 NOTE — Patient Instructions (Signed)
Give us 2-3 business days to get the results of your labs back.   Keep the diet clean and stay active.  Please get me a copy of your advanced directive form at your convenience.   Let us know if you need anything.  

## 2021-09-09 NOTE — Progress Notes (Signed)
Chief Complaint  Patient presents with   Annual Exam     Well Woman Nicole Haney is here for a complete physical.   Her last physical was >1 year ago.  Current diet: in general, a "healthy" diet. Current exercise: yoga, active in yard. Weight is stable and she denies fatigue out of ordinary. Seatbelt? Yes Advanced directive? No  Health Maintenance Pap/HPV- Yes Mammogram- Yes Colon cancer screening-Yes Shingrix- Yes Tetanus- Yes Hep C screening- Yes HIV screening- Yes  Past Medical History:  Diagnosis Date   Seasonal allergies      Past Surgical History:  Procedure Laterality Date   ELBOW SURGERY     RT   ELBOW SURGERY Right 2015   Second elbow surgery    MOUTH SURGERY     lower jaw gums    Medications  Current Outpatient Medications on File Prior to Visit  Medication Sig Dispense Refill   Multiple Vitamin (MULTIVITAMIN) capsule Take 1 capsule by mouth daily.      Allergies No Known Allergies  Review of Systems: Constitutional:  no unexpected weight changes Eye:  no recent significant change in vision Ear/Nose/Mouth/Throat:  Ears:  no recent change in hearing Nose/Mouth/Throat:  no complaints of nasal congestion, no sore throat Cardiovascular: no chest pain Respiratory:  no shortness of breath Gastrointestinal:  no abdominal pain, no change in bowel habits GU:  Female: negative for dysuria or pelvic pain Musculoskeletal/Extremities:  no pain of the joints Integumentary (Skin/Breast):  no abnormal skin lesions reported Neurologic:  no headaches Endocrine:  denies fatigue  Exam BP 118/82   Pulse 76   Temp 97.7 F (36.5 C) (Oral)   Ht 5' 7.5" (1.715 m)   Wt 256 lb 2 oz (116.2 kg)   SpO2 93%   BMI 39.52 kg/m  General:  well developed, well nourished, in no apparent distress Skin:  no significant moles, warts, or growths Head:  no masses, lesions, or tenderness Eyes:  pupils equal and round, sclera anicteric without injection Ears:  canals  without lesions, TMs shiny without retraction, no obvious effusion, no erythema Nose:  nares patent, septum midline, mucosa normal, and no drainage or sinus tenderness Throat/Pharynx:  lips and gingiva without lesion; tongue and uvula midline; non-inflamed pharynx; no exudates or postnasal drainage Neck: neck supple without adenopathy, thyromegaly, or masses Lungs:  clear to auscultation, breath sounds equal bilaterally, no respiratory distress Cardio:  regular rate and rhythm, no LE edema Abdomen:  abdomen soft, nontender; bowel sounds normal; no masses or organomegaly Genital: Defer to GYN Musculoskeletal:  symmetrical muscle groups noted without atrophy or deformity Extremities:  no clubbing, cyanosis, or edema, no deformities, no skin discoloration Neuro:  gait normal; deep tendon reflexes normal and symmetric Psych: well oriented with normal range of affect and appropriate judgment/insight  Assessment and Plan  Well adult exam - Plan: CBC, Comprehensive metabolic panel, Lipid panel   Well 66 y.o. female. Counseled on diet and exercise. Advanced directive form provided today.  Other orders as above. Follow up in 1 yr or prn. The patient voiced understanding and agreement to the plan.  [name], DO 09/09/21 1:49 PM

## 2021-09-10 LAB — COMPREHENSIVE METABOLIC PANEL
ALT: 14 U/L (ref 0–35)
AST: 14 U/L (ref 0–37)
Albumin: 4.4 g/dL (ref 3.5–5.2)
Alkaline Phosphatase: 59 U/L (ref 39–117)
BUN: 14 mg/dL (ref 6–23)
CO2: 29 mEq/L (ref 19–32)
Calcium: 9.5 mg/dL (ref 8.4–10.5)
Chloride: 102 mEq/L (ref 96–112)
Creatinine, Ser: 0.74 mg/dL (ref 0.40–1.20)
GFR: 85.92 mL/min (ref >60.00)
Glucose, Bld: 84 mg/dL (ref 70–99)
Potassium: 4.2 mEq/L (ref 3.5–5.1)
Sodium: 139 mEq/L (ref 135–145)
Total Bilirubin: 0.4 mg/dL (ref 0.2–1.2)
Total Protein: 6.9 g/dL (ref 6.0–8.3)

## 2021-09-10 LAB — CBC
HCT: 39.4 % (ref 36.0–46.0)
Hemoglobin: 13.2 g/dL (ref 12.0–15.0)
MCHC: 33.6 g/dL (ref 30.0–36.0)
MCV: 96.3 fl (ref 78.0–100.0)
Platelets: 237 10*3/uL (ref 150.0–400.0)
RBC: 4.09 Mil/uL (ref 3.87–5.11)
RDW: 14.1 % (ref 11.5–15.5)
WBC: 5.7 10*3/uL (ref 4.0–10.5)

## 2021-09-10 LAB — LIPID PANEL
Cholesterol: 211 mg/dL — ABNORMAL HIGH (ref 0–200)
HDL: 76.3 mg/dL (ref >39.00)
LDL Cholesterol: 116 mg/dL — ABNORMAL HIGH (ref 0–99)
NonHDL: 134.4
Total CHOL/HDL Ratio: 3
Triglycerides: 91 mg/dL (ref 0.0–149.0)
VLDL: 18.2 mg/dL (ref 0.0–40.0)
